# Patient Record
Sex: Female | Born: 1935 | Race: White | Hispanic: No | Smoking: Never smoker
Health system: Southern US, Community
[De-identification: ages and names within clinical notes are randomized; demographics above are authoritative.]

## PROBLEM LIST (undated history)

## (undated) DIAGNOSIS — K219 Gastro-esophageal reflux disease without esophagitis: Secondary | ICD-10-CM

## (undated) DIAGNOSIS — T8859XA Other complications of anesthesia, initial encounter: Secondary | ICD-10-CM

## (undated) DIAGNOSIS — R011 Cardiac murmur, unspecified: Secondary | ICD-10-CM

## (undated) DIAGNOSIS — I10 Essential (primary) hypertension: Secondary | ICD-10-CM

## (undated) DIAGNOSIS — L309 Dermatitis, unspecified: Secondary | ICD-10-CM

## (undated) DIAGNOSIS — F329 Major depressive disorder, single episode, unspecified: Secondary | ICD-10-CM

## (undated) DIAGNOSIS — F32A Depression, unspecified: Secondary | ICD-10-CM

## (undated) DIAGNOSIS — N189 Chronic kidney disease, unspecified: Secondary | ICD-10-CM

## (undated) DIAGNOSIS — M1990 Unspecified osteoarthritis, unspecified site: Secondary | ICD-10-CM

## (undated) DIAGNOSIS — T4145XA Adverse effect of unspecified anesthetic, initial encounter: Secondary | ICD-10-CM

## (undated) DIAGNOSIS — F419 Anxiety disorder, unspecified: Secondary | ICD-10-CM

## (undated) DIAGNOSIS — N6029 Fibroadenosis of unspecified breast: Secondary | ICD-10-CM

## (undated) HISTORY — DX: Cardiac murmur, unspecified: R01.1

## (undated) HISTORY — DX: Chronic kidney disease, unspecified: N18.9

## (undated) HISTORY — PX: MASTECTOMY: SHX3

## (undated) HISTORY — DX: Fibroadenosis of unspecified breast: N60.29

## (undated) HISTORY — DX: Essential (primary) hypertension: I10

## (undated) HISTORY — PX: HEMORRHOID SURGERY: SHX153

## (undated) HISTORY — PX: JOINT REPLACEMENT: SHX530

---

## 2004-12-26 ENCOUNTER — Ambulatory Visit: Payer: Self-pay | Admitting: Internal Medicine

## 2006-06-25 ENCOUNTER — Ambulatory Visit: Payer: Self-pay | Admitting: Internal Medicine

## 2007-08-03 IMAGING — NM NUCLEAR MEDICINE WHOLE BODY BONE SCINTIGRAPHY
2 series · 8 of 8 positions shown · non-contrast
Comparison: none

REASON FOR EXAM: Chest wall and right leg pain, history of  breast CA
COMMENTS:

[Series 1000: 3 hr wholebody · 2.40mm/px · 2 of 2 frames shown]
[frame 1/2]
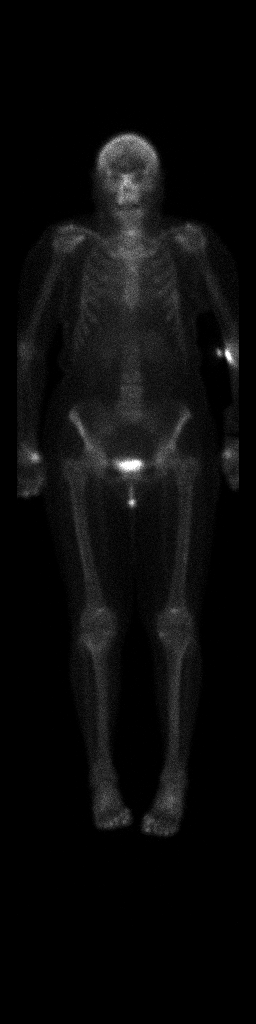
[frame 2/2]
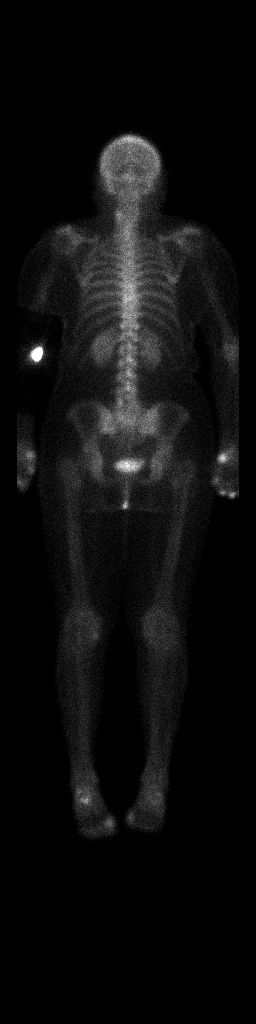

[Series 1000: statics · 2.40mm/px · 3 acquisitions, 6 frames shown]
[im 1/3]
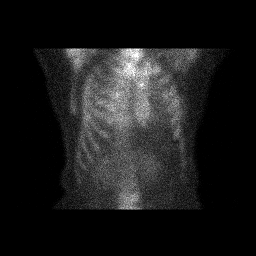
[im 1/3]
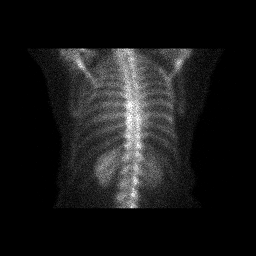
[im 2/3]
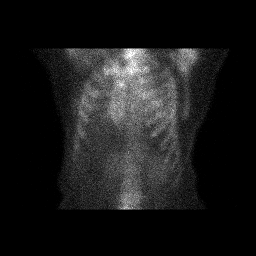
[im 2/3]
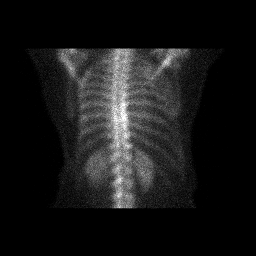
[im 3/3]
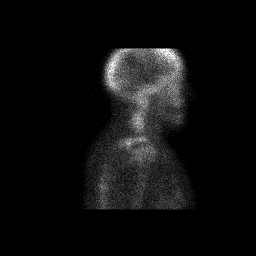
[im 3/3]
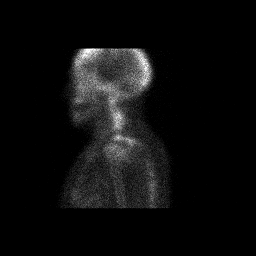

[8 of 8 positions shown; findings below may reference images not displayed]

PROCEDURE:     NM  - NM BONE WB 3 HR [DATE]  [DATE]

RESULT:     The patient received an injection of 23.74 mCi of Technetium 99m
MDP. Anterior and posterior whole body images are obtained as were static
images over the cervical region in the lateral projection and oblique views
over the chest.

The study shows increased localization over the ethmoid region and cervical
region as well as over the shoulders, wrists, knees and ankles. The
appearance suggests most likely degenerative change. This appears to be most
pronounced in the hands and wrists and in the feet and ankles and slightly
greater on the LEFT than the RIGHT. There is some increased localization
over the cervical spine region to the LEFT of midline in the mid to lower
cervical spine which is likely degenerative. No definite evidence of
metastatic disease is demonstrated. No focal rib lesions are evident.
IMPRESSION: Abnormal Bone Scan with areas of increased localization as
described. The appearance is most likely secondary to degenerative bony
disease.

## 2008-01-08 ENCOUNTER — Ambulatory Visit: Payer: Self-pay | Admitting: Neurology

## 2008-01-20 ENCOUNTER — Ambulatory Visit: Payer: Self-pay | Admitting: Emergency Medicine

## 2008-03-17 ENCOUNTER — Ambulatory Visit: Payer: Self-pay | Admitting: Ophthalmology

## 2010-01-11 ENCOUNTER — Ambulatory Visit: Payer: Self-pay | Admitting: Ophthalmology

## 2010-02-22 ENCOUNTER — Ambulatory Visit: Payer: Self-pay | Admitting: Ophthalmology

## 2010-04-03 ENCOUNTER — Ambulatory Visit: Payer: Self-pay | Admitting: Unknown Physician Specialty

## 2010-08-23 ENCOUNTER — Ambulatory Visit: Payer: Self-pay | Admitting: Unknown Physician Specialty

## 2010-08-25 ENCOUNTER — Ambulatory Visit: Payer: Self-pay | Admitting: Unknown Physician Specialty

## 2011-02-28 ENCOUNTER — Ambulatory Visit: Payer: Self-pay | Admitting: Surgery

## 2012-08-06 ENCOUNTER — Ambulatory Visit: Payer: Self-pay | Admitting: Family Medicine

## 2015-06-22 ENCOUNTER — Other Ambulatory Visit: Payer: Self-pay | Admitting: Family Medicine

## 2015-06-22 DIAGNOSIS — Z780 Asymptomatic menopausal state: Secondary | ICD-10-CM

## 2015-09-26 ENCOUNTER — Other Ambulatory Visit: Payer: Self-pay | Admitting: Internal Medicine

## 2015-09-26 DIAGNOSIS — Z1231 Encounter for screening mammogram for malignant neoplasm of breast: Secondary | ICD-10-CM

## 2015-10-11 ENCOUNTER — Ambulatory Visit: Payer: Self-pay | Admitting: Obstetrics and Gynecology

## 2015-10-17 ENCOUNTER — Encounter: Payer: Self-pay | Admitting: *Deleted

## 2015-10-19 ENCOUNTER — Encounter: Payer: Self-pay | Admitting: Obstetrics and Gynecology

## 2015-10-19 ENCOUNTER — Ambulatory Visit (INDEPENDENT_AMBULATORY_CARE_PROVIDER_SITE_OTHER): Payer: Medicare Other | Admitting: Obstetrics and Gynecology

## 2015-10-19 VITALS — BP 142/83 | HR 75 | Resp 16 | Ht 64.0 in | Wt 168.9 lb

## 2015-10-19 DIAGNOSIS — R32 Unspecified urinary incontinence: Secondary | ICD-10-CM | POA: Diagnosis not present

## 2015-10-19 DIAGNOSIS — R944 Abnormal results of kidney function studies: Secondary | ICD-10-CM

## 2015-10-19 LAB — URINALYSIS, COMPLETE
Bilirubin, UA: NEGATIVE
Glucose, UA: NEGATIVE
Ketones, UA: NEGATIVE
Leukocytes, UA: NEGATIVE
Nitrite, UA: NEGATIVE
PH UA: 5.5 (ref 5.0–7.5)
PROTEIN UA: NEGATIVE
Specific Gravity, UA: 1.02 (ref 1.005–1.030)
UUROB: 0.2 mg/dL (ref 0.2–1.0)

## 2015-10-19 LAB — MICROSCOPIC EXAMINATION

## 2015-10-19 LAB — BLADDER SCAN AMB NON-IMAGING: Scan Result: 23

## 2015-10-19 NOTE — Progress Notes (Signed)
10/19/2015 9:10 AM   Lisa Stokes [birth year] [id number]  Referring provider: No referring provider defined for this encounter.  Chief Complaint  Patient presents with  . Urinary Incontinence  . Establish Care    HPI: Patient is a 81yo female presenting today as a referral from her PCP for complaints of urinary incontinence.  Patient reports a history of mixed incontinence since she had a severe case of bronchitis approximately 18 months ago.  She reports that she "coughed so much that she destroyed her muscles down there".  She also reports occasional bowel incontinence.  She reports baseline mild SUI prior to episode of bronchitis.  She now uses 4-5 pads per day with 3 saturations.  Patient reports history of "cystitis" flares every 2-3 weeks when she experiences dysuria. She had treated these flares with OTC cystex-plus with good relief of symptoms.  Patient states that she has tried oral OAB medications in the past but cannot recall which ones.  She states that she was recently told that her kidney function was decreasing by her PCP and would like to discuss this further.    PMH: Past Medical History  Diagnosis Date  . CRI (chronic renal insufficiency)   . Heart murmur   . HTN (hypertension)   . Sclerosing adenosis of breast     Surgical History: Past Surgical History  Procedure Laterality Date  . Mastectomy Bilateral   . Hemorrhoid surgery      external    Home Medications:    Medication List       This list is accurate as of: 10/19/15  9:10 AM.  Always use your most recent med list.               atenolol 50 MG tablet  Commonly known as:  TENORMIN  TAKE 1 TABLET BY MOUTH EACH DAY     benzonatate 200 MG capsule  Commonly known as:  TESSALON  TAKE ONE CAPSULE BY MOUTH THREE TIMES DAILY AS NEEDED FOR COUGH FOR UP TO SEVEN DAYS     CHERATUSSIN AC 100-10 MG/5ML syrup  Generic drug:  guaiFENesin-codeine  TAKE 5 MLS BY MOUTH NIGHTLY AS NEEDED FOR COUGH  FOR UP TO 7 DAYS     FIRST-DUKES MOUTHWASH MT     gabapentin 300 MG capsule  Commonly known as:  NEURONTIN  Take 1 capsule (300 mg total) by mouth nightly. Increase to 2 capsules if needed     ibuprofen 200 MG tablet  Commonly known as:  ADVIL,MOTRIN  Take by mouth.     lansoprazole 15 MG capsule  Commonly known as:  PREVACID  Take by mouth.     lisinopril 20 MG tablet  Commonly known as:  PRINIVIL,ZESTRIL  TAKE 1 TABLET BY MOUTH EACH DAY     nystatin ointment  Commonly known as:  MYCOSTATIN  APPLY SMALL AMOUNTS TO CORNER OF MOUTH 2 TO 3 TIMES A DAY     PARoxetine 20 MG tablet  Commonly known as:  PAXIL  Take by mouth.     RA NAPROXEN SODIUM 220 MG tablet  Generic drug:  naproxen sodium  Take by mouth.     triamcinolone ointment 0.1 %  Commonly known as:  KENALOG  APPLY SMALL AMOUNT TO CORNERS OF MOUTH 2 TO 3 TIMES A DAY        Allergies:  Allergies  Allergen Reactions  . Augmentin [Amoxicillin-Pot Clavulanate]   . Clindamycin/Lincomycin   . Demerol [Meperidine]     Family History:  Family History  Problem Relation Age of Onset  . Colon cancer Father   . Coronary artery disease Brother   . Leukemia Brother   . Stomach cancer Maternal Grandmother   . Breast cancer Maternal Grandmother     Social History:  reports that she has never smoked. She does not have any smokeless tobacco history on file. She reports that she does not drink alcohol or use illicit drugs.  ROS: UROLOGY Frequent Urination?: No Hard to postpone urination?: No Burning/pain with urination?: Yes Get up at night to urinate?: Yes Leakage of urine?: Yes Urine stream starts and stops?: No Trouble starting stream?: Yes Do you have to strain to urinate?: No Blood in urine?: No Urinary tract infection?: No Sexually transmitted disease?: No Injury to kidneys or bladder?: No Painful intercourse?: No Weak stream?: Yes Currently pregnant?: No Vaginal bleeding?: No Last menstrual  period?: n  Gastrointestinal Nausea?: No Vomiting?: No Indigestion/heartburn?: Yes Diarrhea?: Yes Constipation?: Yes  Constitutional Fever: No Night sweats?: No Weight loss?: No Fatigue?: No  Skin Skin rash/lesions?: No Itching?: No  Eyes Blurred vision?: No Double vision?: No  Ears/Nose/Throat Sore throat?: No Sinus problems?: No  Hematologic/Lymphatic Swollen glands?: No Easy bruising?: No  Cardiovascular Leg swelling?: No Chest pain?: No  Respiratory Cough?: No Shortness of breath?: No  Endocrine Excessive thirst?: No  Musculoskeletal Back pain?: Yes Joint pain?: No  Neurological Headaches?: No Dizziness?: Yes  Psychologic Depression?: No Anxiety?: No  Physical Exam: BP 142/83 mmHg  Pulse 75  Resp 16  Ht  (1.626 m)  Wt 168 lb 14.4 oz (76.613 kg)  BMI 28.98 kg/m2  Constitutional:  Alert and oriented, No acute distress. HEENT: [place] AT, moist mucus membranes.  Trachea midline, no masses. Cardiovascular: No clubbing, cyanosis, or edema. Respiratory: Normal respiratory effort, no increased work of breathing. GI: Abdomen is soft, nontender, nondistended, no abdominal masses GU: No CVA tenderness.  Pelvic: vaginal mucosa pale with decreased rugae, grade 1-2 cystocele, minimal urine leakage with valsalva, small urethral caruncle, hard stool palpated through posterior vaginal vaultu Skin: No rashes, bruises or suspicious lesions. Lymph: No cervical or inguinal adenopathy. Neurologic: Grossly intact, no focal deficits, moving all 4 extremities. Psychiatric: Normal mood and affect.  Laboratory Data:   Urinalysis No results found for: COLORURINE, APPEARANCEUR, LABSPEC, PHURINE, GLUCOSEU, HGBUR, BILIRUBINUR, KETONESUR, PROTEINUR, UROBILINOGEN, NITRITE, LEUKOCYTESUR  Pertinent Imaging:   Assessment & Plan:   1. Incontinence- Minimal bladder prolapse present on today's exam.  Patient declined trial of OAB medication.  Patient states that she  has become accustomed to her symptoms and is not particularly bothered by them. We discussed the possible benefits of pelvic floor physical therapy. Patient is agreeable to a referral to PT. Referral placed.  She states that she would like to f/u here prn. - Urinalysis, Complete - BLADDER SCAN AMB NON-IMAGING  2. Decresed GFR- Recent GFR 48.  I discussed with patient that her declining renal function would be best addressed by a nephrologist.  She states that she will speak to her PCP regarding a referral to nephrology.  No Follow-up on file.  These notes generated with voice recognition software. I apologize for typographical errors.  Earlie Lou, FNP  [facility] [address] [place], [place] [address] [phone]

## 2016-09-04 ENCOUNTER — Other Ambulatory Visit: Payer: Self-pay | Admitting: Internal Medicine

## 2016-09-04 DIAGNOSIS — Z1231 Encounter for screening mammogram for malignant neoplasm of breast: Secondary | ICD-10-CM

## 2018-01-01 ENCOUNTER — Other Ambulatory Visit: Payer: Self-pay | Admitting: Family Medicine

## 2018-01-01 DIAGNOSIS — Z780 Asymptomatic menopausal state: Secondary | ICD-10-CM

## 2018-02-15 ENCOUNTER — Emergency Department: Payer: Medicare Other

## 2018-02-15 ENCOUNTER — Other Ambulatory Visit: Payer: Self-pay

## 2018-02-15 ENCOUNTER — Inpatient Hospital Stay
Admission: EM | Admit: 2018-02-15 | Discharge: 2018-02-18 | DRG: 481 | Disposition: A | Discharge status: Skilled Nursing Facility | Payer: Medicare Other | Attending: Specialist | Admitting: Specialist

## 2018-02-15 DIAGNOSIS — R252 Cramp and spasm: Secondary | ICD-10-CM | POA: Diagnosis not present

## 2018-02-15 DIAGNOSIS — I129 Hypertensive chronic kidney disease with stage 1 through stage 4 chronic kidney disease, or unspecified chronic kidney disease: Secondary | ICD-10-CM | POA: Diagnosis present

## 2018-02-15 DIAGNOSIS — Y92009 Unspecified place in unspecified non-institutional (private) residence as the place of occurrence of the external cause: Secondary | ICD-10-CM | POA: Diagnosis not present

## 2018-02-15 DIAGNOSIS — W010XXA Fall on same level from slipping, tripping and stumbling without subsequent striking against object, initial encounter: Secondary | ICD-10-CM | POA: Diagnosis present

## 2018-02-15 DIAGNOSIS — F329 Major depressive disorder, single episode, unspecified: Secondary | ICD-10-CM | POA: Diagnosis present

## 2018-02-15 DIAGNOSIS — E871 Hypo-osmolality and hyponatremia: Secondary | ICD-10-CM | POA: Diagnosis not present

## 2018-02-15 DIAGNOSIS — R011 Cardiac murmur, unspecified: Secondary | ICD-10-CM | POA: Diagnosis present

## 2018-02-15 DIAGNOSIS — Z79899 Other long term (current) drug therapy: Secondary | ICD-10-CM

## 2018-02-15 DIAGNOSIS — W19XXXA Unspecified fall, initial encounter: Secondary | ICD-10-CM

## 2018-02-15 DIAGNOSIS — Z885 Allergy status to narcotic agent status: Secondary | ICD-10-CM | POA: Diagnosis not present

## 2018-02-15 DIAGNOSIS — E861 Hypovolemia: Secondary | ICD-10-CM | POA: Diagnosis not present

## 2018-02-15 DIAGNOSIS — F419 Anxiety disorder, unspecified: Secondary | ICD-10-CM | POA: Diagnosis present

## 2018-02-15 DIAGNOSIS — M25572 Pain in left ankle and joints of left foot: Secondary | ICD-10-CM

## 2018-02-15 DIAGNOSIS — S72009A Fracture of unspecified part of neck of unspecified femur, initial encounter for closed fracture: Secondary | ICD-10-CM | POA: Diagnosis present

## 2018-02-15 DIAGNOSIS — Z881 Allergy status to other antibiotic agents status: Secondary | ICD-10-CM | POA: Diagnosis not present

## 2018-02-15 DIAGNOSIS — N189 Chronic kidney disease, unspecified: Secondary | ICD-10-CM | POA: Diagnosis present

## 2018-02-15 DIAGNOSIS — S72002A Fracture of unspecified part of neck of left femur, initial encounter for closed fracture: Secondary | ICD-10-CM

## 2018-02-15 DIAGNOSIS — S72142A Displaced intertrochanteric fracture of left femur, initial encounter for closed fracture: Principal | ICD-10-CM | POA: Diagnosis present

## 2018-02-15 LAB — COMPREHENSIVE METABOLIC PANEL
ALBUMIN: 4.3 g/dL (ref 3.5–5.0)
ALT: 27 U/L (ref 14–54)
ANION GAP: 10 (ref 5–15)
AST: 28 U/L (ref 15–41)
Alkaline Phosphatase: 68 U/L (ref 38–126)
BUN: 22 mg/dL — ABNORMAL HIGH (ref 6–20)
CHLORIDE: 104 mmol/L (ref 101–111)
CO2: 22 mmol/L (ref 22–32)
Calcium: 9 mg/dL (ref 8.9–10.3)
Creatinine, Ser: 0.88 mg/dL (ref 0.44–1.00)
GFR calc non Af Amer: 60 mL/min — ABNORMAL LOW (ref >60)
Glucose, Bld: 139 mg/dL — ABNORMAL HIGH (ref 65–99)
Potassium: 4.1 mmol/L (ref 3.5–5.1)
SODIUM: 136 mmol/L (ref 135–145)
Total Bilirubin: 1 mg/dL (ref 0.3–1.2)
Total Protein: 7.4 g/dL (ref 6.5–8.1)

## 2018-02-15 LAB — URINALYSIS, COMPLETE (UACMP) WITH MICROSCOPIC
BACTERIA UA: NONE SEEN
BILIRUBIN URINE: NEGATIVE
Glucose, UA: 50 mg/dL — AB
HGB URINE DIPSTICK: NEGATIVE
KETONES UR: 5 mg/dL — AB
LEUKOCYTES UA: NEGATIVE
NITRITE: NEGATIVE
PROTEIN: NEGATIVE mg/dL
Specific Gravity, Urine: 1.018 (ref 1.005–1.030)
pH: 5 (ref 5.0–8.0)

## 2018-02-15 LAB — SURGICAL PCR SCREEN
MRSA, PCR: NEGATIVE
Staphylococcus aureus: NEGATIVE

## 2018-02-15 LAB — CBC WITH DIFFERENTIAL/PLATELET
BASOS PCT: 0 %
Basophils Absolute: 0 10*3/uL (ref 0–0.1)
EOS ABS: 0.2 10*3/uL (ref 0–0.7)
EOS PCT: 2 %
HCT: 47 % (ref 35.0–47.0)
Hemoglobin: 16.3 g/dL — ABNORMAL HIGH (ref 12.0–16.0)
LYMPHS ABS: 1 10*3/uL (ref 1.0–3.6)
Lymphocytes Relative: 10 %
MCH: 31.4 pg (ref 26.0–34.0)
MCHC: 34.7 g/dL (ref 32.0–36.0)
MCV: 90.4 fL (ref 80.0–100.0)
MONOS PCT: 6 %
Monocytes Absolute: 0.6 10*3/uL (ref 0.2–0.9)
Neutro Abs: 8.3 10*3/uL — ABNORMAL HIGH (ref 1.4–6.5)
Neutrophils Relative %: 82 %
PLATELETS: 205 10*3/uL (ref 150–440)
RBC: 5.2 MIL/uL (ref 3.80–5.20)
RDW: 13.5 % (ref 11.5–14.5)
WBC: 10.1 10*3/uL (ref 3.6–11.0)

## 2018-02-15 LAB — PROTIME-INR
INR: 1
Prothrombin Time: 13.1 seconds (ref 11.4–15.2)

## 2018-02-15 LAB — TYPE AND SCREEN
ABO/RH(D): A NEG
ANTIBODY SCREEN: NEGATIVE

## 2018-02-15 LAB — APTT: aPTT: 26 seconds (ref 24–36)

## 2018-02-15 LAB — TROPONIN I: Troponin I: 0.03 ng/mL (ref <0.03)

## 2018-02-15 MED ORDER — PAROXETINE HCL 20 MG PO TABS
20.0000 mg | ORAL_TABLET | Freq: Every day | ORAL | Status: DC
  Administered 2018-02-15 – 2018-02-18 (×3): 20 mg via ORAL
  Filled 2018-02-15 (×3): qty 1

## 2018-02-15 MED ORDER — CHLORHEXIDINE GLUCONATE 4 % EX LIQD
Freq: Once | CUTANEOUS | Status: AC
  Administered 2018-02-16: via TOPICAL

## 2018-02-15 MED ORDER — DIAZEPAM 5 MG/ML IJ SOLN: 2.0000 mg | Freq: Once | INTRAMUSCULAR | Status: DC

## 2018-02-15 MED ORDER — ACETAMINOPHEN 325 MG PO TABS: 650.0000 mg | ORAL_TABLET | Freq: Four times a day (QID) | ORAL | Status: DC | PRN

## 2018-02-15 MED ORDER — LORAZEPAM 2 MG/ML IJ SOLN
1.0000 mg | INTRAMUSCULAR | Status: DC | PRN
  Administered 2018-02-16: 1 mg via INTRAVENOUS
  Filled 2018-02-15: qty 1

## 2018-02-15 MED ORDER — ONDANSETRON HCL 4 MG/2ML IJ SOLN: 4.0000 mg | Freq: Four times a day (QID) | INTRAMUSCULAR | Status: DC | PRN

## 2018-02-15 MED ORDER — CEFAZOLIN SODIUM-DEXTROSE 2-4 GM/100ML-% IV SOLN
2.0000 g | INTRAVENOUS | Status: AC
  Administered 2018-02-16: 2 g via INTRAVENOUS
  Filled 2018-02-15 (×2): qty 100

## 2018-02-15 MED ORDER — CYCLOBENZAPRINE HCL 10 MG PO TABS
10.0000 mg | ORAL_TABLET | Freq: Three times a day (TID) | ORAL | Status: DC | PRN
  Administered 2018-02-15 – 2018-02-16 (×3): 10 mg via ORAL
  Filled 2018-02-15 (×5): qty 1

## 2018-02-15 MED ORDER — FENTANYL CITRATE (PF) 100 MCG/2ML IJ SOLN
50.0000 ug | Freq: Once | INTRAMUSCULAR | Status: AC
  Administered 2018-02-15: 50 ug via INTRAVENOUS

## 2018-02-15 MED ORDER — SODIUM CHLORIDE 0.9 % IV SOLN
INTRAVENOUS | Status: DC
  Administered 2018-02-15 – 2018-02-17 (×3): via INTRAVENOUS

## 2018-02-15 MED ORDER — MORPHINE SULFATE (PF) 2 MG/ML IV SOLN: 2.0000 mg | INTRAVENOUS | Status: DC | PRN

## 2018-02-15 MED ORDER — ACETAMINOPHEN 650 MG RE SUPP: 650.0000 mg | Freq: Four times a day (QID) | RECTAL | Status: DC | PRN

## 2018-02-15 MED ORDER — HYDROCODONE-ACETAMINOPHEN 5-325 MG PO TABS
1.0000 | ORAL_TABLET | ORAL | Status: DC | PRN
  Administered 2018-02-15 – 2018-02-16 (×2): 2 via ORAL
  Filled 2018-02-15 (×2): qty 2

## 2018-02-15 MED ORDER — ATENOLOL 25 MG PO TABS
50.0000 mg | ORAL_TABLET | Freq: Every day | ORAL | Status: DC
  Administered 2018-02-15 – 2018-02-18 (×4): 50 mg via ORAL
  Filled 2018-02-15 (×4): qty 2

## 2018-02-15 MED ORDER — MORPHINE SULFATE (PF) 2 MG/ML IV SOLN
1.0000 mg | INTRAVENOUS | Status: DC | PRN
  Administered 2018-02-15 – 2018-02-16 (×4): 1 mg via INTRAVENOUS
  Filled 2018-02-15 (×4): qty 1

## 2018-02-15 MED ORDER — ONDANSETRON HCL 4 MG PO TABS: 4.0000 mg | ORAL_TABLET | Freq: Four times a day (QID) | ORAL | Status: DC | PRN

## 2018-02-15 MED ORDER — FENTANYL CITRATE (PF) 100 MCG/2ML IJ SOLN
INTRAMUSCULAR | Status: AC
  Filled 2018-02-15: qty 2

## 2018-02-15 NOTE — ED Triage Notes (Signed)
Pt arrived via ems for c/o fall and left hip pain - she states she has fell several times over the last few weeks

## 2018-02-15 NOTE — Progress Notes (Signed)
Patient admitted from ED. [place]ucks traction applied. Family at bedside.

## 2018-02-15 NOTE — ED Notes (Signed)
Pt was given both doses of fentanyl from the same vial - charge nurse and pharmacy aware

## 2018-02-15 NOTE — H&P (Signed)
[facility] [place] [facility]   PATIENT NAME: Lisa Stokes    MR#:  [id number]  DATE OF BIRTH:  1934-08-24  DATE OF ADMISSION:  02/15/2018  PRIMARY CARE PHYSICIAN: Mickey Farber, MD   REQUESTING/REFERRING PHYSICIAN:  Nita Sickle, MD     CHIEF COMPLAINT:   Chief Complaint  Patient presents with  . Fall  . Hip Pain    HISTORY OF PRESENT ILLNESS: Lisa Stokes  is a 84 y.o. female with a known history of hypertension who states that she has been kind of wobbly on her feet recently who went to the kitchen and fell patient was noted to have a left-sided femoral fracture.  Patient complains of severe pain and spasms.  She otherwise denies any cardiac or pulmonary history.  Denies any chest pain or shortness of breath     PAST MEDICAL HISTORY:   Past Medical History:  Diagnosis Date  . CRI (chronic renal insufficiency)   . Heart murmur   . HTN (hypertension)   . Sclerosing adenosis of breast     PAST SURGICAL HISTORY:  Past Surgical History:  Procedure Laterality Date  . HEMORRHOID SURGERY     external  . MASTECTOMY Bilateral     SOCIAL HISTORY:  Social History   Tobacco Use  . Smoking status: Never Smoker  . Smokeless tobacco: Never Used  Substance Use Topics  . Alcohol use: No    Alcohol/week: 0.0 oz    FAMILY HISTORY:  Family History  Problem Relation Age of Onset  . Colon cancer Father   . Coronary artery disease Brother   . Leukemia Brother   . Stomach cancer Maternal Grandmother   . Breast cancer Maternal Grandmother     DRUG ALLERGIES:  Allergies  Allergen Reactions  . Augmentin [Amoxicillin-Pot Clavulanate]   . Clindamycin/Lincomycin   . Demerol [Meperidine]   . Percocet [Oxycodone-Acetaminophen]     REVIEW OF SYSTEMS:   CONSTITUTIONAL: No fever, fatigue or weakness.  EYES: No blurred or double vision.  EARS, NOSE, AND THROAT: No tinnitus or ear pain.  RESPIRATORY: No cough, shortness of breath,  wheezing or hemoptysis.  CARDIOVASCULAR: No chest pain, orthopnea, edema.  GASTROINTESTINAL: No nausea, vomiting, diarrhea or abdominal pain.  GENITOURINARY: No dysuria, hematuria.  ENDOCRINE: No polyuria, nocturia,  HEMATOLOGY: No anemia, easy bruising or bleeding SKIN: No rash or lesion. MUSCULOSKELETAL: Left hip pain NEUROLOGIC: No tingling, numbness, weakness.  PSYCHIATRY: No anxiety or depression.   MEDICATIONS AT HOME:  Prior to Admission medications   Medication Sig Start Date End Date Taking? Authorizing Provider  atenolol (TENORMIN) 50 MG tablet TAKE 1 TABLET BY MOUTH EACH DAY 09/19/15  Yes [provider]  lisinopril (PRINIVIL,ZESTRIL) 40 MG tablet Take 40 mg by mouth daily. for blood pressure 01/29/18  Yes [provider]  PARoxetine (PAXIL) 20 MG tablet Take 20 mg by mouth daily. 01/29/18  Yes [provider]      PHYSICAL EXAMINATION:   VITAL SIGNS: Blood pressure (!) 188/94, pulse 78, temperature 97.8 F (36.6 C), temperature source Oral, resp. rate 16, height 5\' 4"  (1.626 m), weight 172 lb (78 kg), SpO2 95 %.  GENERAL:  84 y.o.-year-old patient lying in the bed with no acute distress.  EYES: Pupils equal, round, reactive to light and accommodation. No scleral icterus. Extraocular muscles intact.  HEENT: Head atraumatic, normocephalic. Oropharynx and nasopharynx clear.  NECK:  Supple, no jugular venous distention. No thyroid enlargement, no tenderness.  LUNGS: Normal breath sounds bilaterally,  no wheezing, rales,rhonchi or crepitation. No use of accessory muscles of respiration.  CARDIOVASCULAR: S1, S2 normal. No murmurs, rubs, or gallops.  ABDOMEN: Soft, nontender, nondistended. Bowel sounds present. No organomegaly or mass.  EXTREMITIES: No pedal edema, cyanosis, or clubbing.  NEUROLOGIC: Cranial nerves II through XII are intact. Muscle strength 5/5 in all extremities. Sensation intact. Gait not checked.  PSYCHIATRIC: The patient is alert and  oriented x 3.  SKIN: No obvious rash, lesion, or ulcer.   LABORATORY PANEL:   CBC Recent Labs  Lab 02/15/18 1545  WBC 10.1  HGB 16.3*  HCT 47.0  PLT 205  MCV 90.4  MCH 31.4  MCHC 34.7  RDW 13.5  LYMPHSABS 1.0  MONOABS 0.6  EOSABS 0.2  BASOSABS 0.0   ------------------------------------------------------------------------------------------------------------------  Chemistries  Recent Labs  Lab 02/15/18 1545  NA 136  K 4.1  CL 104  CO2 22  GLUCOSE 139*  BUN 22*  CREATININE 0.88  CALCIUM 9.0  AST 28  ALT 27  ALKPHOS 68  BILITOT 1.0   ------------------------------------------------------------------------------------------------------------------ estimated creatinine clearance is 50.7 mL/min (by C-G formula based on SCr of 0.88 mg/dL). ------------------------------------------------------------------------------------------------------------------ No results for input(s): TSH, T4TOTAL, T3FREE, THYROIDAB in the last 72 hours.  Invalid input(s): FREET3   Coagulation profile Recent Labs  Lab 02/15/18 1545  INR 1.00   ------------------------------------------------------------------------------------------------------------------- No results for input(s): DDIMER in the last 72 hours. -------------------------------------------------------------------------------------------------------------------  Cardiac Enzymes No results for input(s): CKMB, TROPONINI, MYOGLOBIN in the last 168 hours.  Invalid input(s): CK ------------------------------------------------------------------------------------------------------------------ Invalid input(s): POCBNP  ---------------------------------------------------------------------------------------------------------------  Urinalysis    Component Value Date/Time   APPEARANCEUR Clear 10/19/2015 0858   GLUCOSEU Negative 10/19/2015 0858   BILIRUBINUR Negative 10/19/2015 0858   PROTEINUR Negative 10/19/2015 0858    NITRITE Negative 10/19/2015 0858   LEUKOCYTESUR Negative 10/19/2015 0858     RADIOLOGY: Dg Chest Portable 1 View  Result Date: 02/15/2018 CLINICAL DATA:  Preop.  No history of heart and lung disease. EXAM: PORTABLE CHEST 1 VIEW COMPARISON:  Chest CT, 04/03/2010 FINDINGS: Cardiac silhouette is borderline enlarged. No mediastinal or hilar masses. No convincing adenopathy. There is prominence of the aortic arch. Clear lungs.  No pleural effusion or pneumothorax. Skeletal structures are demineralized but grossly intact. IMPRESSION: No acute cardiopulmonary disease. Electronically Signed   By: Amie Portlandavid  Ormond M.D.   On: 02/15/2018 16:58   Dg Hip Unilat With Pelvis 2-3 Views Left  Result Date: 02/15/2018 CLINICAL DATA:  Fall, left hip pain. EXAM: DG HIP (WITH OR WITHOUT PELVIS) 2-3V LEFT COMPARISON:  None FINDINGS: There is a nondisplaced left femoral intertrochanteric fracture. No subluxation or dislocation. Mild symmetric degenerative changes in the hips bilaterally. IMPRESSION: Nondisplaced left femoral intertrochanteric fracture. Electronically Signed   By: Charlett NoseKevin  Dover M.D.   On: 02/15/2018 16:18    EKG: Orders placed or performed during the hospital encounter of 02/15/18  . ED EKG  . ED EKG  . EKG 12-Lead  . EKG 12-Lead    IMPRESSION AND PLAN: Patient is a 84 year old white female status post fall 1.  Left-sided hip fracture after a fall Preoperatively patient has no cardiopulmonary symptoms Medically stable for surgery She is at moderate risk of surgical complication  2.  Accelerated hypertension continue therapy with atenolol I will use as needed IV hydralazine  3.  Depression anxiety continue Paxil  4.  DVT prophylaxis currently on hold due for surgery tomorrow    All the records are reviewed and case discussed with ED provider. Management plans discussed with  the patient, family and they are in agreement.  CODE STATUS: Code Status History    This patient does not have a  recorded code status. Please follow your organizational policy for patients in this situation.       TOTAL TIME TAKING CARE OF THIS PATIENT:89minutes.    Auburn Bilberry M.D on 02/15/2018 at 5:07 PM  Between 7am to 6pm - Pager - [phone]  After 6pm go to [email or web address] - password [email or web address]  [facility] Office  [phone]  CC: Primary care physician; Mickey Farber, MD

## 2018-02-15 NOTE — ED Provider Notes (Signed)
[facility] Emergency Department Provider Note  ____________________________________________  Time seen: Approximately 4:02 PM  I have reviewed the triage vital signs and the nursing notes.   HISTORY  Chief Complaint Fall and Hip Pain   HPI Lisa Stokes is a 84 y.o. female with a history of chronic kidney disease and hypertension who presents for evaluation of mechanical fall. Patient reports that she was walking in her house and she tripped and fell onto her left hip area she was unable to stand up. She crawled to the telephone and call 911. She is complaining of 8 out of 10 sharp left-sided hip pain worse with movement or palpation. The pain is improved after she received fentanyl per EMS. She denies head trauma or LOC. She is not on blood thinners. She reports 1 prior fall in the last 2 weeks. She does not have a walker or cane. According to her daughters patient has been a little bit tired this week. No recurrent falls. No recent illness like URI symptoms, fever, vomiting, diarrhea, dysuria. Patient denies headache, neck pain, back pain, chest pain, abdominal pain.  Past Medical History:  Diagnosis Date  . CRI (chronic renal insufficiency)   . Heart murmur   . HTN (hypertension)   . Sclerosing adenosis of breast     Patient Active Problem List   Diagnosis Date Noted  . Hip fracture (HCC) 02/15/2018    Past Surgical History:  Procedure Laterality Date  . HEMORRHOID SURGERY     external  . MASTECTOMY Bilateral     Prior to Admission medications   Medication Sig Start Date End Date Taking? Authorizing Provider  atenolol (TENORMIN) 50 MG tablet TAKE 1 TABLET BY MOUTH EACH DAY 09/19/15  Yes [provider]  lisinopril (PRINIVIL,ZESTRIL) 40 MG tablet Take 40 mg by mouth daily. for blood pressure 01/29/18  Yes [provider]  PARoxetine (PAXIL) 20 MG tablet Take 20 mg by mouth daily. 01/29/18  Yes [provider]     Allergies Augmentin [amoxicillin-pot clavulanate]; Clindamycin/lincomycin; Demerol [meperidine]; and Percocet [oxycodone-acetaminophen]  Family History  Problem Relation Age of Onset  . Colon cancer Father   . Coronary artery disease Brother   . Leukemia Brother   . Stomach cancer Maternal Grandmother   . Breast cancer Maternal Grandmother     Social History Social History   Tobacco Use  . Smoking status: Never Smoker  . Smokeless tobacco: Never Used  Substance Use Topics  . Alcohol use: No    Alcohol/week: 0.0 oz  . Drug use: No    Review of Systems Constitutional: Negative for fever. Eyes: Negative for visual changes. ENT: Negative for facial injury or neck injury Cardiovascular: Negative for chest injury. Respiratory: Negative for shortness of breath. Negative for chest wall injury. Gastrointestinal: Negative for abdominal pain or injury. Genitourinary: Negative for dysuria. Musculoskeletal: Negative for back injury, + L hip pain Skin: Negative for laceration/abrasions. Neurological: Negative for head injury.   ____________________________________________   PHYSICAL EXAM:  VITAL SIGNS: ED Triage Vitals  Enc Vitals Group     BP 02/15/18 1541 (!) 188/94     Pulse Rate 02/15/18 1541 78     Resp 02/15/18 1541 16     Temp 02/15/18 1541 97.8 F (36.6 C)     Temp Source 02/15/18 1541 Oral     SpO2 02/15/18 1538 95 %     Weight 02/15/18 1541 172 lb (78 kg)     Height 02/15/18 1541 5\' 4"  (  1.626 m)     Head Circumference --      Peak Flow --      Pain Score 02/15/18 1541 3     Pain Loc --      Pain Edu? --      Excl. in GC? --    Full spinal precautions maintained throughout the trauma exam. Constitutional: Alert and oriented. No acute distress. Does not appear intoxicated. HEENT Head: Normocephalic and atraumatic. Face: No facial bony tenderness. Stable midface Ears: No hemotympanum bilaterally. No Battle sign Eyes: No eye injury. PERRL. No raccoon  eyes Nose: Nontender. No epistaxis. No rhinorrhea Mouth/Throat: Mucous membranes are moist. No oropharyngeal blood. No dental injury. Airway patent without stridor. Normal voice. Neck: no C-collar in place. No midline c-spine tenderness.  Cardiovascular: Normal rate, regular rhythm. Normal and symmetric distal pulses are present in all extremities. Pulmonary/Chest: Chest wall is stable and nontender to palpation/compression. Normal respiratory effort. Breath sounds are normal. No crepitus.  Abdominal: Soft, nontender, non distended. Musculoskeletal: Tenderness with internal rotation of the L hip, limb is not shortened or externally rotated. Nontender with normal full range of motion in all other extremities. No deformities. No thoracic or lumbar midline spinal tenderness. Pelvis is stable. Skin: Skin is warm, dry and intact. No abrasions or contutions. Psychiatric: Speech and behavior are appropriate. Neurological: Normal speech and language. Moves all extremities to command. No gross focal neurologic deficits are appreciated.  Glascow Coma Score: 4 - Opens eyes on own 6 - Follows simple motor commands 5 - Alert and oriented GCS: 15  ____________________________________________   LABS (all labs ordered are listed, but only abnormal results are displayed)  Labs Reviewed  CBC WITH DIFFERENTIAL/PLATELET - Abnormal; Notable for the following components:      Result Value   Hemoglobin 16.3 (*)    Neutro Abs 8.3 (*)    All other components within normal limits  COMPREHENSIVE METABOLIC PANEL - Abnormal; Notable for the following components:   Glucose, Bld 139 (*)    BUN 22 (*)    GFR calc non Af Amer 60 (*)    All other components within normal limits  URINALYSIS, COMPLETE (UACMP) WITH MICROSCOPIC - Abnormal; Notable for the following components:   Color, Urine YELLOW (*)    APPearance CLEAR (*)    Glucose, UA 50 (*)    Ketones, ur 5 (*)    Squamous Epithelial / LPF 0-5 (*)    All  other components within normal limits  TROPONIN I - Abnormal; Notable for the following components:   Troponin I 0.03 (*)    All other components within normal limits  SURGICAL PCR SCREEN  PROTIME-INR  APTT  CBC  BASIC METABOLIC PANEL  TYPE AND SCREEN   ____________________________________________  EKG  ED ECG REPORT I, Nita Sicklearolina Wynetta Seith, the attending physician, personally viewed and interpreted this ECG.  Normal sinus rhythm, rate of 76, normal intervals, normal axis, LVH with T-wave inversions in the lateral leads, no ST elevations or depressions. No prior for comparison. ____________________________________________  RADIOLOGY  I have personally reviewed the images performed during this visit and I agree with the Radiologist's read.   Interpretation by Radiologist:  Dg Chest Portable 1 View  Result Date: 02/15/2018 CLINICAL DATA:  Preop.  No history of heart and lung disease. EXAM: PORTABLE CHEST 1 VIEW COMPARISON:  Chest CT, 04/03/2010 FINDINGS: Cardiac silhouette is borderline enlarged. No mediastinal or hilar masses. No convincing adenopathy. There is prominence of the aortic arch. Clear lungs.  No pleural effusion or pneumothorax. Skeletal structures are demineralized but grossly intact. IMPRESSION: No acute cardiopulmonary disease. Electronically Signed   By: Amie Portland M.D.   On: 02/15/2018 16:58   Dg Hip Unilat With Pelvis 2-3 Views Left  Result Date: 02/15/2018 CLINICAL DATA:  Fall, left hip pain. EXAM: DG HIP (WITH OR WITHOUT PELVIS) 2-3V LEFT COMPARISON:  None FINDINGS: There is a nondisplaced left femoral intertrochanteric fracture. No subluxation or dislocation. Mild symmetric degenerative changes in the hips bilaterally. IMPRESSION: Nondisplaced left femoral intertrochanteric fracture. Electronically Signed   By: Charlett Nose M.D.   On: 02/15/2018 16:18    ____________________________________________   PROCEDURES  Procedure(s) performed:  None Procedures Critical Care performed:  None ____________________________________________   INITIAL IMPRESSION / ASSESSMENT AND PLAN / ED COURSE  84 y.o. female with a history of chronic kidney disease and hypertension who presents for evaluation of L hip pain s/p mechanical fall. No head trauma or LOC, no blood thinner. LLE with no obvious deformity, patient with ttp with rotation of the L hip. XR pending. Since patient seems more fatigued this week will check labs/ UA/ EKG to rule out worsening kidney injury, dehydration, anemia, electrolyte abnormalities, arrhythmia, or UTI.    _________________________ 4:29 PM on 02/15/2018 -----------------------------------------  X-ray positive for hip fracture. Discussed with Dr. Hyacinth Meeker orthopedics will plan for surgical repair in the morning. Patient will be made nothing by mouth at midnight. Discussed with the hospitalist for admission.   As part of my medical decision making, I reviewed the following data within the electronic MEDICAL RECORD NUMBER History obtained from family, Nursing notes reviewed and incorporated, Labs reviewed , EKG interpreted , Discussed with admitting physician , A consult was requested and obtained from this/these consultant(s) Ortho, Notes from prior ED visits and [place] Controlled Substance Database    Pertinent labs & imaging results that were available during my care of the patient were reviewed by me and considered in my medical decision making (see chart for details).    ____________________________________________   FINAL CLINICAL IMPRESSION(S) / ED DIAGNOSES  Final diagnoses:  Closed fracture of left hip, initial encounter (HCC)  Fall, initial encounter      NEW MEDICATIONS STARTED DURING THIS VISIT:  ED Discharge Orders    None       Note:  This document was prepared using Dragon voice recognition software and may include unintentional dictation errors.    Don Perking, [place], MD 02/16/18 0001

## 2018-02-15 NOTE — Consult Note (Signed)
ORTHOPAEDIC CONSULTATION  REQUESTING PHYSICIAN: Auburn Bilberry, MD  Chief Complaint: left hip pain   HPI: Lisa Stokes is a 84 y.o. female who complains of  Left hip pain after a fall at home today.  Exam and x-rays in the ER show a minimally displaced left intertrochanteric hip fracture.  Treatment options including surgery and non operative were discussed with the patient and family at length.  Risks of surgery and post op protocol also discussed.  She wishes to proceed with surgery in the morning.   Past Medical History:  Diagnosis Date  . CRI (chronic renal insufficiency)   . Heart murmur   . HTN (hypertension)   . Sclerosing adenosis of breast    Past Surgical History:  Procedure Laterality Date  . HEMORRHOID SURGERY     external  . MASTECTOMY Bilateral    Social History   Socioeconomic History  . Marital status: Married    Spouse name: None  . Number of children: None  . Years of education: None  . Highest education level: None  Social Needs  . Financial resource strain: None  . Food insecurity - worry: None  . Food insecurity - inability: None  . Transportation needs - medical: None  . Transportation needs - non-medical: None  Occupational History  . None  Tobacco Use  . Smoking status: Never Smoker  . Smokeless tobacco: Never Used  Substance and Sexual Activity  . Alcohol use: No    Alcohol/week: 0.0 oz  . Drug use: No  . Sexual activity: None  Other Topics Concern  . None  Social History Narrative  . None   Family History  Problem Relation Age of Onset  . Colon cancer Father   . Coronary artery disease Brother   . Leukemia Brother   . Stomach cancer Maternal Grandmother   . Breast cancer Maternal Grandmother    Allergies  Allergen Reactions  . Augmentin [Amoxicillin-Pot Clavulanate]   . Clindamycin/Lincomycin   . Demerol [Meperidine]   . Percocet [Oxycodone-Acetaminophen]    Prior to Admission medications   Medication Sig Start  Date End Date Taking? Authorizing Provider  atenolol (TENORMIN) 50 MG tablet TAKE 1 TABLET BY MOUTH EACH DAY 09/19/15  Yes [provider]  lisinopril (PRINIVIL,ZESTRIL) 40 MG tablet Take 40 mg by mouth daily. for blood pressure 01/29/18  Yes [provider]  PARoxetine (PAXIL) 20 MG tablet Take 20 mg by mouth daily. 01/29/18  Yes [provider]   Dg Chest Portable 1 View  Result Date: 02/15/2018 CLINICAL DATA:  Preop.  No history of heart and lung disease. EXAM: PORTABLE CHEST 1 VIEW COMPARISON:  Chest CT, 04/03/2010 FINDINGS: Cardiac silhouette is borderline enlarged. No mediastinal or hilar masses. No convincing adenopathy. There is prominence of the aortic arch. Clear lungs.  No pleural effusion or pneumothorax. Skeletal structures are demineralized but grossly intact. IMPRESSION: No acute cardiopulmonary disease. Electronically Signed   By: Amie Portland M.D.   On: 02/15/2018 16:58   Dg Hip Unilat With Pelvis 2-3 Views Left  Result Date: 02/15/2018 CLINICAL DATA:  Fall, left hip pain. EXAM: DG HIP (WITH OR WITHOUT PELVIS) 2-3V LEFT COMPARISON:  None FINDINGS: There is a nondisplaced left femoral intertrochanteric fracture. No subluxation or dislocation. Mild symmetric degenerative changes in the hips bilaterally. IMPRESSION: Nondisplaced left femoral intertrochanteric fracture. Electronically Signed   By: Charlett Nose M.D.   On: 02/15/2018 16:18    Positive ROS: All other systems have been reviewed and were otherwise  negative with the exception of those mentioned in the HPI and as above.  Physical Exam: General: Alert, no acute distress Cardiovascular: No pedal edema Respiratory: No cyanosis, no use of accessory musculature GI: No organomegaly, abdomen is soft and non-tender Skin: No lesions in the area of chief complaint Neurologic: Sensation intact distally Psychiatric: Patient is competent for consent with normal mood and affect Lymphatic: No axillary or  cervical lymphadenopathy  MUSCULOSKELETAL: Left leg painful to rotate.  Length good.  Skin and csm intact. No other injuries noted.   Assessment: Left hip fracture  Plan: ORIF in AM with TFN    Valinda HoarMILLER,Naomee Nowland E, MD [phone]   02/15/2018 10:59 PM

## 2018-02-16 ENCOUNTER — Encounter
Admission: EM | Disposition: A | Discharge status: Skilled Nursing Facility | Payer: Self-pay | Source: Home / Self Care | Attending: Specialist

## 2018-02-16 ENCOUNTER — Inpatient Hospital Stay: Payer: Medicare Other | Admitting: Certified Registered Nurse Anesthetist

## 2018-02-16 ENCOUNTER — Inpatient Hospital Stay: Payer: Medicare Other

## 2018-02-16 HISTORY — PX: INTRAMEDULLARY (IM) NAIL INTERTROCHANTERIC: SHX5875

## 2018-02-16 LAB — CBC
HCT: 41.6 % (ref 35.0–47.0)
HEMOGLOBIN: 14.6 g/dL (ref 12.0–16.0)
MCH: 31.6 pg (ref 26.0–34.0)
MCHC: 35 g/dL (ref 32.0–36.0)
MCV: 90.3 fL (ref 80.0–100.0)
Platelets: 195 10*3/uL (ref 150–440)
RBC: 4.61 MIL/uL (ref 3.80–5.20)
RDW: 13.2 % (ref 11.5–14.5)
WBC: 9.4 10*3/uL (ref 3.6–11.0)

## 2018-02-16 LAB — BASIC METABOLIC PANEL
Anion gap: 8 (ref 5–15)
BUN: 20 mg/dL (ref 6–20)
CHLORIDE: 101 mmol/L (ref 101–111)
CO2: 24 mmol/L (ref 22–32)
CREATININE: 0.82 mg/dL (ref 0.44–1.00)
Calcium: 8.4 mg/dL — ABNORMAL LOW (ref 8.9–10.3)
GFR calc Af Amer: >60 mL/min (ref >60)
GFR calc non Af Amer: >60 mL/min (ref >60)
Glucose, Bld: 134 mg/dL — ABNORMAL HIGH (ref 65–99)
Potassium: 4.4 mmol/L (ref 3.5–5.1)
SODIUM: 133 mmol/L — AB (ref 135–145)

## 2018-02-16 SURGERY — FIXATION, FRACTURE, INTERTROCHANTERIC, WITH INTRAMEDULLARY ROD
Anesthesia: General | Site: Hip | Laterality: Left | Wound class: Clean

## 2018-02-16 MED ORDER — BISACODYL 10 MG RE SUPP
10.0000 mg | Freq: Every day | RECTAL | Status: DC | PRN
  Administered 2018-02-18: 10 mg via RECTAL
  Filled 2018-02-16: qty 1

## 2018-02-16 MED ORDER — ONDANSETRON HCL 4 MG/2ML IJ SOLN: 4.0000 mg | Freq: Once | INTRAMUSCULAR | Status: DC | PRN

## 2018-02-16 MED ORDER — PROPOFOL 500 MG/50ML IV EMUL
INTRAVENOUS | Status: AC
  Filled 2018-02-16: qty 50

## 2018-02-16 MED ORDER — MORPHINE SULFATE (PF) 2 MG/ML IV SOLN
0.5000 mg | INTRAVENOUS | Status: DC | PRN
  Administered 2018-02-16: 1 mg via INTRAVENOUS
  Filled 2018-02-16: qty 1

## 2018-02-16 MED ORDER — LIDOCAINE HCL (PF) 2 % IJ SOLN
INTRAMUSCULAR | Status: AC
  Filled 2018-02-16: qty 10

## 2018-02-16 MED ORDER — MIDAZOLAM HCL 5 MG/5ML IJ SOLN
INTRAMUSCULAR | Status: DC | PRN
  Administered 2018-02-16: 1 mg via INTRAVENOUS

## 2018-02-16 MED ORDER — ZOLPIDEM TARTRATE 5 MG PO TABS: 5.0000 mg | ORAL_TABLET | Freq: Every evening | ORAL | Status: DC | PRN

## 2018-02-16 MED ORDER — BUPIVACAINE-EPINEPHRINE (PF) 0.25% -1:200000 IJ SOLN
INTRAMUSCULAR | Status: AC
  Filled 2018-02-16: qty 30

## 2018-02-16 MED ORDER — BUPIVACAINE-EPINEPHRINE (PF) 0.5% -1:200000 IJ SOLN
INTRAMUSCULAR | Status: AC
Start: 2018-02-16 — End: 2018-02-16
  Filled 2018-02-16: qty 30

## 2018-02-16 MED ORDER — OXYMETAZOLINE HCL 0.05 % NA SOLN
NASAL | Status: AC
  Filled 2018-02-16: qty 15

## 2018-02-16 MED ORDER — FENTANYL CITRATE (PF) 100 MCG/2ML IJ SOLN: 25.0000 ug | INTRAMUSCULAR | Status: DC | PRN

## 2018-02-16 MED ORDER — FERROUS SULFATE 325 (65 FE) MG PO TABS
325.0000 mg | ORAL_TABLET | Freq: Every day | ORAL | Status: DC
  Administered 2018-02-17 – 2018-02-18 (×2): 325 mg via ORAL
  Filled 2018-02-16 (×2): qty 1

## 2018-02-16 MED ORDER — METOCLOPRAMIDE HCL 10 MG PO TABS: 5.0000 mg | ORAL_TABLET | Freq: Three times a day (TID) | ORAL | Status: DC | PRN

## 2018-02-16 MED ORDER — PROPOFOL 500 MG/50ML IV EMUL
INTRAVENOUS | Status: DC | PRN
  Administered 2018-02-16: 25 ug/kg/min via INTRAVENOUS

## 2018-02-16 MED ORDER — PHENOL 1.4 % MT LIQD
1.0000 | OROMUCOSAL | Status: DC | PRN
  Filled 2018-02-16: qty 177

## 2018-02-16 MED ORDER — ACETAMINOPHEN 325 MG PO TABS: 325.0000 mg | ORAL_TABLET | Freq: Four times a day (QID) | ORAL | Status: DC | PRN

## 2018-02-16 MED ORDER — ALUM & MAG HYDROXIDE-SIMETH 200-200-20 MG/5ML PO SUSP: 30.0000 mL | ORAL | Status: DC | PRN

## 2018-02-16 MED ORDER — FLEET ENEMA 7-19 GM/118ML RE ENEM: 1.0000 | ENEMA | Freq: Once | RECTAL | Status: DC | PRN

## 2018-02-16 MED ORDER — NEOMYCIN-POLYMYXIN B GU 40-200000 IR SOLN
Status: DC | PRN
  Administered 2018-02-16: 2 mL

## 2018-02-16 MED ORDER — CEFAZOLIN SODIUM-DEXTROSE 2-4 GM/100ML-% IV SOLN
2.0000 g | Freq: Three times a day (TID) | INTRAVENOUS | Status: AC
  Administered 2018-02-16 – 2018-02-17 (×3): 2 g via INTRAVENOUS
  Filled 2018-02-16 (×3): qty 100

## 2018-02-16 MED ORDER — BACLOFEN 5 MG HALF TABLET
5.0000 mg | ORAL_TABLET | ORAL | Status: AC
  Administered 2018-02-16: 5 mg via ORAL
  Filled 2018-02-16: qty 1

## 2018-02-16 MED ORDER — BUPIVACAINE-EPINEPHRINE (PF) 0.25% -1:200000 IJ SOLN
INTRAMUSCULAR | Status: DC | PRN
  Administered 2018-02-16: 30 mL

## 2018-02-16 MED ORDER — NEOMYCIN-POLYMYXIN B GU 40-200000 IR SOLN
Status: AC
  Filled 2018-02-16: qty 2

## 2018-02-16 MED ORDER — DOCUSATE SODIUM 100 MG PO CAPS
100.0000 mg | ORAL_CAPSULE | Freq: Two times a day (BID) | ORAL | Status: DC
  Administered 2018-02-16 (×2): 100 mg via ORAL
  Filled 2018-02-16 (×2): qty 1

## 2018-02-16 MED ORDER — ONDANSETRON HCL 4 MG PO TABS: 4.0000 mg | ORAL_TABLET | Freq: Four times a day (QID) | ORAL | Status: DC | PRN

## 2018-02-16 MED ORDER — MIDAZOLAM HCL 2 MG/2ML IJ SOLN
INTRAMUSCULAR | Status: AC
  Filled 2018-02-16: qty 2

## 2018-02-16 MED ORDER — HYDROCODONE-ACETAMINOPHEN 5-325 MG PO TABS
1.0000 | ORAL_TABLET | ORAL | Status: DC | PRN
  Administered 2018-02-16: 1 via ORAL
  Administered 2018-02-17: 2 via ORAL
  Filled 2018-02-16: qty 1
  Filled 2018-02-16: qty 2

## 2018-02-16 MED ORDER — TRAMADOL HCL 50 MG PO TABS
50.0000 mg | ORAL_TABLET | Freq: Four times a day (QID) | ORAL | Status: DC
  Administered 2018-02-16 – 2018-02-18 (×9): 50 mg via ORAL
  Filled 2018-02-16 (×9): qty 1

## 2018-02-16 MED ORDER — SODIUM CHLORIDE 0.45 % IV SOLN
INTRAVENOUS | Status: DC
  Administered 2018-02-16 – 2018-02-17 (×2): via INTRAVENOUS

## 2018-02-16 MED ORDER — PHENYLEPHRINE HCL 10 MG/ML IJ SOLN
INTRAMUSCULAR | Status: DC | PRN
  Administered 2018-02-16: 100 ug via INTRAVENOUS
  Administered 2018-02-16 (×2): 200 ug via INTRAVENOUS
  Administered 2018-02-16 (×2): 100 ug via INTRAVENOUS
  Administered 2018-02-16: 200 ug via INTRAVENOUS
  Administered 2018-02-16 (×2): 100 ug via INTRAVENOUS

## 2018-02-16 MED ORDER — METOCLOPRAMIDE HCL 5 MG/ML IJ SOLN: 5.0000 mg | Freq: Three times a day (TID) | INTRAMUSCULAR | Status: DC | PRN

## 2018-02-16 MED ORDER — HYDROCODONE-ACETAMINOPHEN 7.5-325 MG PO TABS
1.0000 | ORAL_TABLET | ORAL | Status: DC | PRN
  Administered 2018-02-17 – 2018-02-18 (×3): 1 via ORAL
  Administered 2018-02-18: 2 via ORAL
  Filled 2018-02-16: qty 1
  Filled 2018-02-16: qty 2
  Filled 2018-02-16 (×2): qty 1

## 2018-02-16 MED ORDER — PROPOFOL 10 MG/ML IV BOLUS
INTRAVENOUS | Status: DC | PRN
  Administered 2018-02-16 (×3): 20 mg via INTRAVENOUS

## 2018-02-16 MED ORDER — MENTHOL 3 MG MT LOZG
1.0000 | LOZENGE | OROMUCOSAL | Status: DC | PRN
  Filled 2018-02-16 (×2): qty 9

## 2018-02-16 MED ORDER — MAGNESIUM HYDROXIDE 400 MG/5ML PO SUSP
30.0000 mL | Freq: Every day | ORAL | Status: DC | PRN
Start: 2018-02-16 — End: 2018-02-18
  Administered 2018-02-17 – 2018-02-18 (×2): 30 mL via ORAL
  Filled 2018-02-16 (×2): qty 30

## 2018-02-16 MED ORDER — ENOXAPARIN SODIUM 30 MG/0.3ML ~~LOC~~ SOLN
30.0000 mg | SUBCUTANEOUS | Status: DC
  Administered 2018-02-17: 30 mg via SUBCUTANEOUS
  Filled 2018-02-16: qty 0.3

## 2018-02-16 MED ORDER — ACETAMINOPHEN 10 MG/ML IV SOLN
INTRAVENOUS | Status: DC | PRN
  Administered 2018-02-16: 1000 mg via INTRAVENOUS

## 2018-02-16 MED ORDER — FENTANYL CITRATE (PF) 100 MCG/2ML IJ SOLN
INTRAMUSCULAR | Status: DC | PRN
  Administered 2018-02-16: 25 ug via INTRAVENOUS

## 2018-02-16 MED ORDER — ONDANSETRON HCL 4 MG/2ML IJ SOLN: 4.0000 mg | Freq: Four times a day (QID) | INTRAMUSCULAR | Status: DC | PRN

## 2018-02-16 MED ORDER — FENTANYL CITRATE (PF) 100 MCG/2ML IJ SOLN
INTRAMUSCULAR | Status: AC
  Filled 2018-02-16: qty 2

## 2018-02-16 MED ORDER — BUPIVACAINE HCL (PF) 0.5 % IJ SOLN
INTRAMUSCULAR | Status: DC | PRN
  Administered 2018-02-16: 2.6 mL

## 2018-02-16 MED ORDER — ACETAMINOPHEN 10 MG/ML IV SOLN
INTRAVENOUS | Status: AC
  Filled 2018-02-16: qty 100

## 2018-02-16 SURGICAL SUPPLY — 36 items
BIT DRILL SPINE 4.0MMX260 (BIT) ×2
BIT DRILL SPINE 4.0X260 (BIT) ×4 IMPLANT
BLADE HELICAL TI 11.0X100 STRL (Orthopedic Implant) ×3 IMPLANT
BNDG COHESIVE 4X5 TAN STRL (GAUZE/BANDAGES/DRESSINGS) ×3 IMPLANT
CANISTER SUCT 1200ML W/VALVE (MISCELLANEOUS) ×3 IMPLANT
CHLORAPREP W/TINT 26ML (MISCELLANEOUS) ×6 IMPLANT
DRAPE C-ARMOR (DRAPES) IMPLANT
DRAPE INCISE 23X17 IOBAN STRL (DRAPES) ×2
DRAPE INCISE IOBAN 23X17 STRL (DRAPES) ×1 IMPLANT
DRSG AQUACEL AG ADV 3.5X10 (GAUZE/BANDAGES/DRESSINGS) ×3 IMPLANT
DRSG AQUACEL AG ADV 3.5X14 (GAUZE/BANDAGES/DRESSINGS) IMPLANT
ELECT REM PT RETURN 9FT ADLT (ELECTROSURGICAL) ×3
ELECTRODE REM PT RTRN 9FT ADLT (ELECTROSURGICAL) ×1 IMPLANT
GAUZE PETRO XEROFOAM 1X8 (MISCELLANEOUS) IMPLANT
GAUZE SPONGE 4X4 12PLY STRL (GAUZE/BANDAGES/DRESSINGS) ×3 IMPLANT
GLOVE INDICATOR 8.0 STRL GRN (GLOVE) ×3 IMPLANT
GLOVE SURG ORTHO 8.5 STRL (GLOVE) ×3 IMPLANT
GOWN STRL REUS W/ TWL LRG LVL3 (GOWN DISPOSABLE) ×1 IMPLANT
GOWN STRL REUS W/TWL LRG LVL3 (GOWN DISPOSABLE) ×2
GOWN STRL REUS W/TWL LRG LVL4 (GOWN DISPOSABLE) ×3 IMPLANT
GUIDEWIRE 3.2X400 (WIRE) ×6 IMPLANT
KIT TURNOVER KIT A (KITS) ×3 IMPLANT
MAT BLUE FLOOR 46X72 FLO (MISCELLANEOUS) ×3 IMPLANT
NAIL TROCH FX 11/130D 170-S (Nail) ×3 IMPLANT
NEEDLE SPNL 18GX3.5 QUINCKE PK (NEEDLE) ×3 IMPLANT
NS IRRIG 500ML POUR BTL (IV SOLUTION) ×3 IMPLANT
PACK HIP COMPR (MISCELLANEOUS) ×3 IMPLANT
REAMER ROD DEEP FLUTE 2.5X950 (INSTRUMENTS) IMPLANT
SCREW LOCK TI 5.0X38 F/IM NAIL (Screw) ×3 IMPLANT
STAPLER SKIN PROX 35W (STAPLE) ×3 IMPLANT
SUCTION FRAZIER HANDLE 10FR (MISCELLANEOUS) ×2
SUCTION TUBE FRAZIER 10FR DISP (MISCELLANEOUS) ×1 IMPLANT
SUT VIC AB 0 CT1 36 (SUTURE) ×3 IMPLANT
SUT VIC AB 2-0 CT1 27 (SUTURE) ×2
SUT VIC AB 2-0 CT1 TAPERPNT 27 (SUTURE) ×1 IMPLANT
SYR 30ML LL (SYRINGE) ×3 IMPLANT

## 2018-02-16 NOTE — Clinical Social Work Note (Signed)
Clinical Social Work Assessment  Patient Details  Name: Lisa Stokes MRN: [id number] Date of Birth: 01-23-1934  Date of referral:  02/16/18               Reason for consult:  Facility Placement                Permission sought to share information with:  Chartered certified accountant granted to share information::  Yes, Verbal Permission Granted  Name::        Agency::  [facility] area SNFs  Relationship::     Contact Information:     Housing/Transportation Living arrangements for the past 2 months:  [place] of Information:  Patient, Medical Team, Adult Children Patient Interpreter Needed:  None Criminal Activity/Legal Involvement Pertinent to Current Situation/Hospitalization:  No - Comment as needed Significant Relationships:  Adult Children Lives with:  Self Do you feel safe going back to the place where you live?  Yes Need for family participation in patient care:  No (Coment)  Care giving concerns:  Patient with hip fracture repair/possible need for STR   Social Worker assessment / plan:  The CSW met with the patient and her family at bedside to discuss discharge planning. The patient was in pain as evident by her constricted affect, verbalization of pain, and tearfulness. The patient's daughters agreed to speak with the CSW. The patient lives alone and would prefer to return home; however, the patient and the family are willing to pursue SNF should that be the recommendation. The CSW explained the process briefly including Medicare payment. The family gave permission to begin the referral.  The CSW will follow and make bed offers pending PT recommendation.   Employment status:  Retired Forensic scientist:  Medicare PT Recommendations:  Not assessed at this time [place] / Referral to community resources:  [place]  Patient/Family's Response to care:  The family thanked the CSW.  Patient/Family's  Understanding of and Emotional Response to Diagnosis, Current Treatment, and Prognosis:  The patient and family would like the patient to return home but are willing to consider STR should PT recommend such.  Emotional Assessment Appearance:  Appears stated age Attitude/Demeanor/Rapport:  Crying Affect (typically observed):  Constricted Orientation:  Oriented to Self, Oriented to Place, Oriented to  Time, Oriented to Situation Alcohol / Substance use:  Never Used Psych involvement (Current and /or in the community):  No (Comment)  Discharge Needs  Concerns to be addressed:  Care Coordination, Discharge Planning Concerns Readmission within the last 30 days:  No Current discharge risk:  None Barriers to Discharge:  Continued Medical Work up   [employer], LCSW 02/16/2018, 4:04 PM

## 2018-02-16 NOTE — Progress Notes (Signed)
[employer] [facility] [facility] [place] [facility]   PATIENT NAME: Lisa Stokes    MR#:  [id number]  DATE OF BIRTH:  May 07, 1934  SUBJECTIVE:   Patient here after a mechanical fall and noted to have a left hip fracture. Patient is status post left hip intramedullary pinning. Patient's family is at bedside. Patient denies any pain presently.  REVIEW OF SYSTEMS:    Review of Systems  Constitutional: Negative for chills and fever.  HENT: Negative for congestion and tinnitus.   Eyes: Negative for blurred vision and double vision.  Respiratory: Negative for cough, shortness of breath and wheezing.   Cardiovascular: Negative for chest pain, orthopnea and PND.  Gastrointestinal: Negative for abdominal pain, diarrhea, nausea and vomiting.  Genitourinary: Negative for dysuria and hematuria.  Neurological: Negative for dizziness, sensory change and focal weakness.  All other systems reviewed and are negative.   Nutrition: Regular  Tolerating Diet: Yes Tolerating PT: Await Eval.    DRUG ALLERGIES:   Allergies  Allergen Reactions  . Augmentin [Amoxicillin-Pot Clavulanate]   . Clindamycin/Lincomycin   . Demerol [Meperidine]   . Percocet [Oxycodone-Acetaminophen]     VITALS:  Blood pressure 120/67, pulse 64, temperature 98.7 F (37.1 C), temperature source Oral, resp. rate 18, height 5\' 4"  (1.626 m), weight 78 kg (172 lb), SpO2 96 %.  PHYSICAL EXAMINATION:   Physical Exam  GENERAL:  84 y.o.-year-old patient lying in bed in no acute distress.  EYES: Pupils equal, round, reactive to light and accommodation. No scleral icterus. Extraocular muscles intact.  HEENT: Head atraumatic, normocephalic. Oropharynx and nasopharynx clear.  NECK:  Supple, no jugular venous distention. No thyroid enlargement, no tenderness.  LUNGS: Normal breath sounds bilaterally, no wheezing, rales, rhonchi. No use of accessory muscles of respiration.  CARDIOVASCULAR: S1, S2 normal. No murmurs, rubs,  or gallops.  ABDOMEN: Soft, nontender, nondistended. Bowel sounds present. No organomegaly or mass.  EXTREMITIES: No cyanosis, clubbing or edema b/l.   Left Hip dressing in place with no bleeding.  NEUROLOGIC: Cranial nerves II through XII are intact. No focal Motor or sensory deficits b/l.   PSYCHIATRIC: The patient is alert and oriented x 3.  SKIN: No obvious rash, lesion, or ulcer.    LABORATORY PANEL:   CBC Recent Labs  Lab 02/16/18 0457  WBC 9.4  HGB 14.6  HCT 41.6  PLT 195   ------------------------------------------------------------------------------------------------------------------  Chemistries  Recent Labs  Lab 02/15/18 1545 02/16/18 0457  NA 136 133*  K 4.1 4.4  CL 104 101  CO2 22 24  GLUCOSE 139* 134*  BUN 22* 20  CREATININE 0.88 0.82  CALCIUM 9.0 8.4*  AST 28  --   ALT 27  --   ALKPHOS 68  --   BILITOT 1.0  --    ------------------------------------------------------------------------------------------------------------------  Cardiac Enzymes Recent Labs  Lab 02/15/18 1545  TROPONINI 0.03*   ------------------------------------------------------------------------------------------------------------------  RADIOLOGY:  Dg Chest Portable 1 View  Result Date: 02/15/2018 CLINICAL DATA:  Preop.  No history of heart and lung disease. EXAM: PORTABLE CHEST 1 VIEW COMPARISON:  Chest CT, 04/03/2010 FINDINGS: Cardiac silhouette is borderline enlarged. No mediastinal or hilar masses. No convincing adenopathy. There is prominence of the aortic arch. Clear lungs.  No pleural effusion or pneumothorax. Skeletal structures are demineralized but grossly intact. IMPRESSION: No acute cardiopulmonary disease. Electronically Signed   By: Amie Portlandavid  Ormond M.D.   On: 02/15/2018 16:58   Dg Hip Operative Unilat W Or W/o Pelvis Left  Result Date: 02/16/2018 CLINICAL DATA:  Left hip intramedullary nail placement. EXAM: OPERATIVE LEFT HIP (WITH PELVIS IF PERFORMED) 3 VIEWS  TECHNIQUE: Fluoroscopic spot image(s) were submitted for interpretation post-operatively. COMPARISON:  Plain films of 1 day prior FINDINGS: Three intraoperative images demonstrate placement of an intramedullary nail and screw fixation device. No hardware complication or new fractures identified. IMPRESSION: Intraoperative imaging of left proximal femoral fixation. Electronically Signed   By: Jeronimo Greaves M.D.   On: 02/16/2018 10:45   Dg Hip Unilat With Pelvis 2-3 Views Left  Result Date: 02/15/2018 CLINICAL DATA:  Fall, left hip pain. EXAM: DG HIP (WITH OR WITHOUT PELVIS) 2-3V LEFT COMPARISON:  None FINDINGS: There is a nondisplaced left femoral intertrochanteric fracture. No subluxation or dislocation. Mild symmetric degenerative changes in the hips bilaterally. IMPRESSION: Nondisplaced left femoral intertrochanteric fracture. Electronically Signed   By: Charlett Nose M.D.   On: 02/15/2018 16:18     ASSESSMENT AND PLAN:   84 year old female with past medical history of hypertension, anxiety who presented to the hospital after mechanical fall and noted to have a left hip fracture.  1. Status post fall and left hip fracture-this was a mechanical fall. Patient is status post left hip intramedullary pinning today. -Continue pain control and further care as per orthopedics. Await physical therapy evaluation.  2. Essential hypertension-continue atenolol.  3. Anxiety-continue Paxil.  All the records are reviewed and case discussed with Care Management/Social Worker. Management plans discussed with the patient, family and they are in agreement.  CODE STATUS: Full code  DVT Prophylaxis: Lovenox  TOTAL TIME TAKING CARE OF THIS PATIENT: 30 minutes.   POSSIBLE D/C IN 1-2 DAYS, DEPENDING ON CLINICAL CONDITION.   Houston Siren M.D on 02/16/2018 at 1:09 PM  Between 7am to 6pm - Pager - [phone]  After 6pm go to [email or web address] - Scientist, research (life sciences) [place] [facility]   Office  [phone]  CC: Primary care physician; Dione Housekeeper, MD

## 2018-02-16 NOTE — Op Note (Signed)
DATE OF SURGERY:  02/16/2018  TIME: 10:23 AM  PATIENT NAME:  Lisa CrazeAnne N Highfill  AGE: 84 y.o.  PRE-OPERATIVE DIAGNOSIS:  left hip fracture intertrochanteric  POST-OPERATIVE DIAGNOSIS:  SAME  PROCEDURE:  INTRAMEDULLARY (IM) NAIL INTERTROCHANTRIC left hip  SURGEON:  Sherran Margolis E  ASST:  EBL: 25 cc  COMPLICATIONS: None  OPERATIVE IMPLANTS: Synthes trochanteric femoral nail 130 degrees / 11 mm with interlocking helical blade 100 mm and distal locking screw 38 mm.  PREOPERATIVE INDICATIONS:  [name] is a 84 y.o. year old who fell and suffered a hip fracture. She was brought into the ER and then admitted and optimized and then elected for surgical intervention.    The risks benefits and alternatives were discussed with the patient including but not limited to the risks of nonoperative treatment, versus surgical intervention including infection, bleeding, nerve injury, malunion, nonunion, hardware prominence, hardware failure, need for hardware removal, blood clots, cardiopulmonary complications, morbidity, mortality, among others, and they were willing to proceed.    OPERATIVE PROCEDURE:  The patient was brought to the operating room and placed in the supine position.  Spinal anesthesia was administered, with a foley. She was placed on the fracture table.  Closed reduction was performed under C-arm guidance. The length of the femur was also measured using fluoroscopy. Time out was then performed after sterile prep and drape. She received preoperative antibiotics.  Incision was made proximal to the greater trochanter. A guidewire was placed in the appropriate position. Confirmation was made on AP and lateral views. The above-named nail was opened. I opened the proximal femur with a reamer. I then placed the nail by hand easily down. I did not need to ream the femur.  Once the nail was completely seated, I placed a guidepin into the femoral head into the center center  position through a second incision.  I measured the length, and then reamed the lateral cortex and up into the head. I then placed the helical blade. Slight compression was applied. Anatomic fixation achieved. Bone quality was mediocre.  I then secured the proximal interlock.  The distal locking screw was then placed and after confirming the position of the fracture fragments and hardware I then removed the instruments, and took final C-arm pictures AP and lateral the entire length of the leg. Anatomic reconstruction was achieved, and the wounds were irrigated copiously and closed with Vicryl  followed by staples and dry sterile dressing. Sponge and needle count were correct.   The patient was awakened and returned to PACU in stable and satisfactory condition. There no complications and the patient tolerated the procedure well.  She will be partial weightbearing as tolerated, and will be on Lovenox  For DVT prophylaxis.     Valinda HoarHoward E Imonie Tuch, M.D.

## 2018-02-16 NOTE — NC FL2 (Signed)
[place] MEDICAID FL2 LEVEL OF CARE SCREENING TOOL     IDENTIFICATION  Patient Name: Lisa Stokes Birthdate: 04-Aug-1934 Sex: female Admission Date (Current Location): 02/15/2018  [place] and [place] Number:  Chiropodist and Address:  [facility], [address], [place], [place] [address]      Provider Number: [id number]  Attending Physician Name and Address:  Houston Siren, MD  Relative Name and Phone Number:  Lisa Stokes [phone]) Daughter    Current Level of Care: Hospital Recommended Level of Care: Skilled Nursing Facility Prior Approval Number:    Date Approved/Denied: 02/16/18 PASRR Number: [id number]  Discharge Plan: SNF    Current Diagnoses: Patient Active Problem List   Diagnosis Date Noted  . Hip fracture (HCC) 02/15/2018    Orientation RESPIRATION BLADDER Height & Weight     Self, Time, Situation, Place  Normal Continent Weight: 172 lb (78 kg) Height:  5\' 4"  (162.6 cm)  BEHAVIORAL SYMPTOMS/MOOD NEUROLOGICAL BOWEL NUTRITION STATUS      Continent    AMBULATORY STATUS COMMUNICATION OF NEEDS Skin   Extensive Assist Verbally Surgical wounds                       Personal Care Assistance Level of Assistance  Bathing, Feeding, Dressing Bathing Assistance: Limited assistance Feeding assistance: Independent Dressing Assistance: Limited assistance     Functional Limitations Info  Sight, Hearing, Speech Sight Info: Adequate Hearing Info: Adequate Speech Info: Adequate    SPECIAL CARE FACTORS FREQUENCY  PT (By licensed PT)     PT Frequency: Up to 5X per day, 5 days per week              Contractures Contractures Info: Not present    Additional Factors Info  Code Status, Allergies Code Status Info: Full Allergies Info: Augmentin Amoxicillin-pot Clavulanate, Clindamycin/lincomycin, Demerol Meperidine, Percocet Oxycodone-acetaminophen           Current Medications (02/16/2018):  This  is the current hospital active medication list Current Facility-Administered Medications  Medication Dose Route Frequency Provider Last Rate Last Dose  . 0.45 % sodium chloride infusion   Intravenous Continuous Deeann Saint, MD 75 mL/hr at 02/16/18 1204    . 0.9 %  sodium chloride infusion   Intravenous Continuous Auburn Bilberry, MD 100 mL/hr at 02/16/18 0552    . [START ON 02/17/2018] acetaminophen (TYLENOL) tablet 325-650 mg  325-650 mg Oral Q6H PRN Deeann Saint, MD      . alum & mag hydroxide-simeth (MAALOX/MYLANTA) 200-200-20 MG/5ML suspension 30 mL  30 mL Oral Q4H PRN Deeann Saint, MD      . atenolol (TENORMIN) tablet 50 mg  50 mg Oral Daily Auburn Bilberry, MD   50 mg at 02/16/18 0743  . bisacodyl (DULCOLAX) suppository 10 mg  10 mg Rectal Daily PRN Deeann Saint, MD      . ceFAZolin (ANCEF) IVPB 2g/100 mL premix  2 g Intravenous Q8H Deeann Saint, MD   Stopped at 02/16/18 1356  . cyclobenzaprine (FLEXERIL) tablet 10 mg  10 mg Oral TID PRN Auburn Bilberry, MD   10 mg at 02/16/18 1518  . docusate sodium (COLACE) capsule 100 mg  100 mg Oral BID Deeann Saint, MD   100 mg at 02/16/18 1203  . [START ON 02/17/2018] enoxaparin (LOVENOX) injection 30 mg  30 mg Subcutaneous Q24H Deeann Saint, MD      . Melene Muller ON 02/17/2018] ferrous sulfate tablet 325 mg  325 mg Oral Q  breakfast Deeann SaintMiller, Howard, MD      . HYDROcodone-acetaminophen [facility](NORCO) 7.5-325 MG per tablet 1-2 tablet  1-2 tablet Oral Q4H PRN Deeann SaintMiller, Howard, MD      . HYDROcodone-acetaminophen (NORCO/VICODIN) 5-325 MG per tablet 1-2 tablet  1-2 tablet Oral Q4H PRN Deeann SaintMiller, Howard, MD   1 tablet at 02/16/18 1449  . LORazepam (ATIVAN) injection 1 mg  1 mg Intravenous Q4H PRN Auburn BilberryPatel, Shreyang, MD   1 mg at 02/16/18 1553  . magnesium hydroxide (MILK OF MAGNESIA) suspension 30 mL  30 mL Oral Daily PRN Deeann SaintMiller, Howard, MD      . menthol-cetylpyridinium (CEPACOL) lozenge 3 mg  1 lozenge Oral PRN Deeann SaintMiller, Howard, MD       Or  . phenol (CHLORASEPTIC)  mouth spray 1 spray  1 spray Mouth/Throat PRN Deeann SaintMiller, Howard, MD      . metoCLOPramide (REGLAN) tablet 5-10 mg  5-10 mg Oral Q8H PRN Deeann SaintMiller, Howard, MD       Or  . metoCLOPramide (REGLAN) injection 5-10 mg  5-10 mg Intravenous Q8H PRN Deeann SaintMiller, Howard, MD      . morphine 2 MG/ML injection 0.5-1 mg  0.5-1 mg Intravenous Q2H PRN Deeann SaintMiller, Howard, MD      . ondansetron [facility](ZOFRAN) tablet 4 mg  4 mg Oral Q6H PRN Deeann SaintMiller, Howard, MD       Or  . ondansetron [facility](ZOFRAN) injection 4 mg  4 mg Intravenous Q6H PRN Deeann SaintMiller, Howard, MD      . PARoxetine (PAXIL) tablet 20 mg  20 mg Oral Daily Auburn BilberryPatel, Shreyang, MD   Stopped at 02/16/18 1000  . sodium phosphate (FLEET) 7-19 GM/118ML enema 1 enema  1 enema Rectal Once PRN Deeann SaintMiller, Howard, MD      . traMADol Janean Sark(ULTRAM) tablet 50 mg  50 mg Oral Q6H Deeann SaintMiller, Howard, MD   50 mg at 02/16/18 1203  . zolpidem (AMBIEN) tablet 5 mg  5 mg Oral QHS PRN Deeann SaintMiller, Howard, MD         Discharge Medications: Please see discharge summary for a list of discharge medications.  Relevant Imaging Results:  Relevant Lab Results:   Additional Information SS# [id number]  Lisa CongKaren M Stellar Gensel, LCSW

## 2018-02-16 NOTE — H&P (Signed)
THE PATIENT WAS SEEN PRIOR TO SURGERY TODAY.  HISTORY, ALLERGIES, HOME MEDICATIONS AND OPERATIVE PROCEDURE WERE REVIEWED. RISKS AND BENEFITS OF SURGERY DISCUSSED WITH PATIENT AGAIN.  NO CHANGES FROM INITIAL HISTORY AND PHYSICAL NOTED.    

## 2018-02-16 NOTE — Anesthesia Procedure Notes (Signed)
Spinal  Patient location during procedure: OR Start time: 02/16/2018 9:17 AM Staffing Anesthesiologist: Piscitello, Precious Haws, MD Performed: anesthesiologist  Preanesthetic Checklist Completed: patient identified, site marked, surgical consent, pre-op evaluation, timeout performed, IV checked, risks and benefits discussed and monitors and equipment checked Spinal Block Patient position: sitting Prep: ChloraPrep Patient monitoring: heart rate, continuous pulse ox, blood pressure and cardiac monitor Approach: midline Location: L4-5 Injection technique: single-shot Needle Needle type: Whitacre, Introducer and Quincke  Needle gauge: 22 G Needle length: 9 cm Assessment Sensory level: T8 Additional Notes Negative paresthesia. Negative blood return. Positive free-flowing CSF. Expiration date of kit checked and confirmed. Patient tolerated procedure well, without complications.

## 2018-02-16 NOTE — Progress Notes (Signed)
IV pain meds given to patient. Family at bedside. IVF lnfusing. Continue to monitor.

## 2018-02-16 NOTE — Anesthesia Post-op Follow-up Note (Signed)
Anesthesia QCDR form completed.        

## 2018-02-16 NOTE — Transfer of Care (Signed)
Immediate Anesthesia Transfer of Care Note  Patient: Lisa Stokes  Procedure(s) Performed: INTRAMEDULLARY (IM) NAIL INTERTROCHANTRIC (Left )  Patient Location: PACU  Anesthesia Type:Spinal  Level of Consciousness: awake, alert  and oriented  Airway & Oxygen Therapy: Patient Spontanous Breathing and Patient connected to face mask oxygen  Post-op Assessment: Report given to RN and Post -op Vital signs reviewed and stable  Post vital signs: Reviewed and stable  Last Vitals:  Vitals:   02/16/18 0720 02/16/18 1030  BP: (!) 147/76 (!) 75/47  Pulse: 76 (!) 55  Resp: 17 19  Temp: 37 C 37 C  SpO2: 91% 98%    Last Pain:  Vitals:   02/16/18 1030  TempSrc:   PainSc: Asleep      Patients Stated Pain Goal: 3 (02/16/18 0742)  Complications: No apparent anesthesia complications

## 2018-02-16 NOTE — Progress Notes (Signed)
Patient c/o extreme muscle spasms. Dr Hyacinth Meekermiller notified, states to try ativan prn[facility] given[facility]

## 2018-02-16 NOTE — Progress Notes (Signed)
Patient back from OR. VSS. O2 2L. IVF infusing. Bucks traction reapplied. Family at bedside.

## 2018-02-16 NOTE — Anesthesia Procedure Notes (Addendum)
Date/Time: 02/16/2018 8:52 AM Performed by: Ginger CarneMichelet, Hope Holst, CRNA Pre-anesthesia Checklist: Patient identified, Emergency Drugs available, Suction available, Patient being monitored and Timeout performed Patient Re-evaluated:Patient Re-evaluated prior to induction Oxygen Delivery Method: Simple face mask

## 2018-02-16 NOTE — Anesthesia Preprocedure Evaluation (Signed)
Anesthesia Evaluation  Patient identified by MRN, date of birth, ID band Patient awake    Reviewed: Allergy & Precautions, H&P , NPO status , Patient's Chart, lab work & pertinent test results, reviewed documented beta blocker date and time   Airway Mallampati: II   Neck ROM: full    Dental  (+) Poor Dentition   Pulmonary neg pulmonary ROS,    Pulmonary exam normal        Cardiovascular hypertension, negative cardio ROS Normal cardiovascular exam Rhythm:regular Rate:Normal     Neuro/Psych negative neurological ROS  negative psych ROS   GI/Hepatic negative GI ROS, Neg liver ROS,   Endo/Other  negative endocrine ROS  Renal/GU negative Renal ROS  negative genitourinary   Musculoskeletal   Abdominal   Peds  Hematology negative hematology ROS (+)   Anesthesia Other Findings Past Medical History: No date: CRI (chronic renal insufficiency) No date: Heart murmur No date: HTN (hypertension) No date: Sclerosing adenosis of breast Past Surgical History: No date: HEMORRHOID SURGERY     Comment:  external No date: MASTECTOMY; Bilateral BMI    Body Mass Index:  29.52 kg/m     Reproductive/Obstetrics negative OB ROS                             Anesthesia Physical Anesthesia Plan  ASA: III and emergent  Anesthesia Plan: General and Spinal   Post-op Pain Management:    Induction:   PONV Risk Score and Plan:   Airway Management Planned:   Additional Equipment:   Intra-op Plan:   Post-operative Plan:   Informed Consent: I have reviewed the patients History and Physical, chart, labs and discussed the procedure including the risks, benefits and alternatives for the proposed anesthesia with the patient or authorized representative who has indicated his/her understanding and acceptance.   Dental Advisory Given  Plan Discussed with: CRNA  Anesthesia Plan Comments:          Anesthesia Quick Evaluation

## 2018-02-16 NOTE — Progress Notes (Signed)
Patient a&o, VSS. IVF infusing. Family at bedside. Traction in place. Surgery planned at 9 by Dr Hyacinth MeekerMiller. Continue to monitor.

## 2018-02-16 NOTE — Progress Notes (Signed)
Patient still complaining of muscle spasms. Dr Cherlynn KaiserSainani paged, give one time dose of baclofen. If this works better than flexeril, then d/c flexeril and start baclofen 5mg  TID prn muscle spasms. Order placed for one time baclofen.

## 2018-02-17 ENCOUNTER — Encounter: Payer: Self-pay | Admitting: Specialist

## 2018-02-17 LAB — CBC
HEMATOCRIT: 40.9 % (ref 35.0–47.0)
Hemoglobin: 14.1 g/dL (ref 12.0–16.0)
MCH: 31.1 pg (ref 26.0–34.0)
MCHC: 34.4 g/dL (ref 32.0–36.0)
MCV: 90.6 fL (ref 80.0–100.0)
Platelets: 176 10*3/uL (ref 150–440)
RBC: 4.52 MIL/uL (ref 3.80–5.20)
RDW: 13.1 % (ref 11.5–14.5)
WBC: 9.9 10*3/uL (ref 3.6–11.0)

## 2018-02-17 LAB — BASIC METABOLIC PANEL
Anion gap: 11 (ref 5–15)
BUN: 11 mg/dL (ref 6–20)
CALCIUM: 8.2 mg/dL — AB (ref 8.9–10.3)
CO2: 19 mmol/L — AB (ref 22–32)
CREATININE: 0.92 mg/dL (ref 0.44–1.00)
Chloride: 98 mmol/L — ABNORMAL LOW (ref 101–111)
GFR calc Af Amer: >60 mL/min (ref >60)
GFR calc non Af Amer: 57 mL/min — ABNORMAL LOW (ref >60)
GLUCOSE: 164 mg/dL — AB (ref 65–99)
Potassium: 3.9 mmol/L (ref 3.5–5.1)
Sodium: 128 mmol/L — ABNORMAL LOW (ref 135–145)

## 2018-02-17 MED ORDER — BACLOFEN 5 MG HALF TABLET
5.0000 mg | ORAL_TABLET | Freq: Three times a day (TID) | ORAL | Status: DC | PRN
Start: 2018-02-17 — End: 2018-02-18
  Administered 2018-02-17 – 2018-02-18 (×2): 5 mg via ORAL
  Filled 2018-02-17 (×3): qty 1

## 2018-02-17 MED ORDER — ENOXAPARIN SODIUM 40 MG/0.4ML ~~LOC~~ SOLN
40.0000 mg | SUBCUTANEOUS | Status: DC
  Administered 2018-02-18: 40 mg via SUBCUTANEOUS
  Filled 2018-02-17: qty 0.4

## 2018-02-17 MED ORDER — DOCUSATE SODIUM 100 MG PO CAPS
100.0000 mg | ORAL_CAPSULE | Freq: Two times a day (BID) | ORAL | Status: DC
  Administered 2018-02-17 – 2018-02-18 (×3): 100 mg via ORAL
  Filled 2018-02-17 (×3): qty 1

## 2018-02-17 NOTE — Evaluation (Signed)
Physical Therapy Evaluation Patient Details Name: Lisa Stokes MRN: [id number] DOB: Dec 26, 1933 Today's Date: 02/17/2018   History of Present Illness  Pt is a 84 y.o F, presented to hospital w/ L hip pain post mechanical fall. Pt admitted 02/15/18 for L femoral fracture. Pt  had surgery 02/16/18 L hip intramedullary pinning. Pt seen for PT eval POD #1.  PMHX includes chronic renal insufficiency, heart murmur, HTN, sclerosing adenosis of breast.    Clinical Impression  Pt demonstrated LLE strength at least 3/5 strength and L hip pain 8/10 w/ supine bed exercises.  Pt demonstrated ability to follow commands and cueing to transfer from supine to sit w/ min assist by therapist to move LLE from bed to floor. However, Pt reported dizziness and lightheadedness while sitting at edge of bed.  Therefore, transfers and ambulation were deferred at this time; will attempt at next PT session as tolerated.  Pt returned to supine position and symptoms resolved. Pt would benefit from skilled PT to promote optimal return to PLOF; recommend transition to SNF upon discharge from acute hospitalization.    Follow Up Recommendations SNF    Equipment Recommendations  Rolling walker with 5" wheels;3in1 (PT)    Recommendations for Other Services       Precautions / Restrictions Precautions Precautions: Fall Restrictions Weight Bearing Restrictions: Yes LLE Weight Bearing: Partial weight bearing LLE Partial Weight Bearing Percentage or Pounds: 50%      Mobility  Bed Mobility Overal bed mobility: Needs Assistance Bed Mobility: Supine to Sit     Supine to sit: HOB elevated;Min assist;Min guard Sit to supine: Mod assist   General bed mobility comments: Pt good technique and required min guard with scooting up in bed. Pt demonstrated good techniqued and required min assist with supine to sit transfer, requiring therapist to move LLE from bed to floor. However, Pt reported dizziness and fatigue sitting EOB  and required mod assist to return from sit to supine.  Transfers                    Ambulation/Gait                Stairs            Wheelchair Mobility    Modified Rankin (Stroke Patients Only)       Balance Overall balance assessment: Needs assistance Sitting-balance support: No upper extremity supported;Feet supported Sitting balance-Leahy Scale: Poor Sitting balance - Comments: Pt reported dizziness when sitting EOB and required support from therapist to sit upright. Pt able to follow commands and could assist with postural control when given vc's to lean forward and hold self up.  However, Pt fatigued quickly.  Pt was returned to supine due to dizziness and fatigue. Postural control: Posterior lean                                   Pertinent Vitals/Pain Pain Assessment: 0-10 Pain Score: 7  Pain Location: L hip(end of session) Pain Descriptors / Indicators: Aching Pain Intervention(s): Limited activity within patient's tolerance;Monitored during session;RN gave pain meds during session;Premedicated before session;Repositioned(PT took 1/2 dose prior to session and RN dispensed other 1/2 during session.)    Home Living Family/patient expects to be discharged to:: Skilled nursing facility Living Arrangements: Alone Available Help at Discharge: Family(2 daughters, [name] & [name]) Type of Home: House Home Access: Stairs to enter Entrance Stairs-Rails: Can reach both  Entrance Stairs-Number of Steps: 3 Home Layout: Two level;Able to live on main level with bedroom/bathroom Home Equipment: None      Prior Function Level of Independence: Independent         Comments: Pt was independent with all ADL's.  Daughters report that Pt did not drive very often.     Hand Dominance   Dominant Hand: Right    Extremity/Trunk Assessment   Upper Extremity Assessment Upper Extremity Assessment: Overall WFL for tasks assessed    Lower Extremity  Assessment Lower Extremity Assessment: LLE deficits/detail LLE Deficits / Details: Pt demonstrated at least 3/5 strength with supine L hip exercises. LLE: Unable to fully assess due to pain    Cervical / Trunk Assessment Cervical / Trunk Assessment: Kyphotic  Communication   Communication: No difficulties  Cognition Arousal/Alertness: Lethargic;Suspect due to medications Behavior During Therapy: [facility] for tasks assessed/performed Overall Cognitive Status: Within Functional Limits for tasks assessed                                 General Comments: Pt very pleasant, just appeared sleepy possibly due to medication.      General Comments  Pt agreeable to session.    Exercises Total Joint Exercises Ankle Circles/Pumps: Both;5 reps;Strengthening;AROM;Supine Quad Sets: AROM;Left;5 reps;Strengthening;Supine Gluteal Sets: AROM;Both;5 reps;Supine Heel Slides: Left;AAROM;Strengthening;5 reps;Supine Hip ABduction/ADduction: AAROM;Strengthening;Left;5 reps;Supine   Assessment/Plan    PT Assessment Patient needs continued PT services  PT Problem List Decreased strength;Decreased range of motion;Decreased activity tolerance;Decreased balance;Decreased mobility;Decreased coordination;Decreased knowledge of use of DME;Decreased safety awareness;Pain       PT Treatment Interventions DME instruction;Gait training;Stair training;Functional mobility training;Therapeutic activities;Therapeutic exercise;Balance training;Patient/family education    PT Goals (Current goals can be found in the Care Plan section)  Acute Rehab PT Goals Patient Stated Goal: I wan to be able to walk again. PT Goal Formulation: With patient Time For Goal Achievement: 03/03/18 Potential to Achieve Goals: Good    Frequency BID   Barriers to discharge        Co-evaluation               AM-PAC PT "6 Clicks" Daily Activity  Outcome Measure Difficulty turning over in bed (including adjusting  bedclothes, sheets and blankets)?: A Little Difficulty moving from lying on back to sitting on the side of the bed? : Unable Difficulty sitting down on and standing up from a chair with arms (e.g., wheelchair, bedside commode, etc,.)?: Unable Help needed moving to and from a bed to chair (including a wheelchair)?: Total Help needed walking in hospital room?: Total Help needed climbing 3-5 steps with a railing? : Total 6 Click Score: 8    End of Session Equipment Utilized During Treatment: Gait belt Activity Tolerance: Patient limited by fatigue;Patient limited by pain Patient left: in bed;with call bell/phone within reach;with bed alarm set;with family/visitor present;with nursing/sitter in room;with SCD's reapplied Nurse Communication: Mobility status(RN notified that there was only 1 SCD in room.) PT Visit Diagnosis: Unsteadiness on feet (R26.81);Other abnormalities of gait and mobility (R26.89);History of falling (Z91.81);Muscle weakness (generalized) (M62.81);Difficulty in walking, not elsewhere classified (R26.2);Pain Pain - Right/Left: Left Pain - part of body: Hip    Time: 0930-1010 PT Time Calculation (min) (ACUTE ONLY): 40 min   Charges:         PT G Codes:          Masaji Billups Salvatore Decent, DPT 02/17/2018, 12:52 PM

## 2018-02-17 NOTE — Progress Notes (Signed)
lovenox dose increased to 40mg  daily due to TBW >45kg and crcl >6330ml/min  Lisa Stokes D Lisa Stokes, Pharm.D, BCPS Clinical Pharmacist

## 2018-02-17 NOTE — Progress Notes (Signed)
[employer] - [place] at [facility]   PATIENT NAME: Lisa Stokes    MR#:  [id number]  DATE OF BIRTH:  1934/07/25  SUBJECTIVE:   Patient here after a mechanical fall and noted to have a left hip fracture. Patient is status post left hip intramedullary pinning POD # 1 today.  Had some leg/back spasms post surgery and improved with Baclofen.  Awaiting PT eval.  . Patient's family is at bedside. Pt. Complaining of some Left hip pain.   REVIEW OF SYSTEMS:    Review of Systems  Constitutional: Negative for chills and fever.  HENT: Negative for congestion and tinnitus.   Eyes: Negative for blurred vision and double vision.  Respiratory: Negative for cough, shortness of breath and wheezing.   Cardiovascular: Negative for chest pain, orthopnea and PND.  Gastrointestinal: Negative for abdominal pain, diarrhea, nausea and vomiting.  Genitourinary: Negative for dysuria and hematuria.  Musculoskeletal: Positive for joint pain (Left Hip).  Neurological: Negative for dizziness, sensory change and focal weakness.  All other systems reviewed and are negative.   Nutrition: Regular  Tolerating Diet: Yes Tolerating PT:  Eval noted.    DRUG ALLERGIES:   Allergies  Allergen Reactions  . Augmentin [Amoxicillin-Pot Clavulanate]   . Clindamycin/Lincomycin   . Demerol [Meperidine]   . Percocet [Oxycodone-Acetaminophen]     VITALS:  Blood pressure 114/66, pulse 77, temperature 98.7 F (37.1 C), temperature source Oral, resp. rate 20, height 5\' 4"  (1.626 m), weight 78 kg (172 lb), SpO2 92 %.  PHYSICAL EXAMINATION:   Physical Exam  GENERAL:  84 y.o.-year-old patient lying in bed in no acute distress.  EYES: Pupils equal, round, reactive to light and accommodation. No scleral icterus. Extraocular muscles intact.  HEENT: Head atraumatic, normocephalic. Oropharynx and nasopharynx clear.  NECK:  Supple, no jugular venous distention. No thyroid enlargement, no tenderness.   LUNGS: Normal breath sounds bilaterally, no wheezing, rales, rhonchi. No use of accessory muscles of respiration.  CARDIOVASCULAR: S1, S2 normal. No murmurs, rubs, or gallops.  ABDOMEN: Soft, nontender, nondistended. Bowel sounds present. No organomegaly or mass.  EXTREMITIES: No cyanosis, clubbing or edema b/l.   Left Hip dressing in place with no bleeding.  NEUROLOGIC: Cranial nerves II through XII are intact. No focal Motor or sensory deficits b/l.   PSYCHIATRIC: The patient is alert and oriented x 3.  SKIN: No obvious rash, lesion, or ulcer.    LABORATORY PANEL:   CBC Recent Labs  Lab 02/17/18 1016  WBC 9.9  HGB 14.1  HCT 40.9  PLT 176   ------------------------------------------------------------------------------------------------------------------  Chemistries  Recent Labs  Lab 02/15/18 1545  02/17/18 1016  NA 136   < > 128*  K 4.1   < > 3.9  CL 104   < > 98*  CO2 22   < > 19*  GLUCOSE 139*   < > 164*  BUN 22*   < > 11  CREATININE 0.88   < > 0.92  CALCIUM 9.0   < > 8.2*  AST 28  --   --   ALT 27  --   --   ALKPHOS 68  --   --   BILITOT 1.0  --   --    < > = values in this interval not displayed.   ------------------------------------------------------------------------------------------------------------------  Cardiac Enzymes Recent Labs  Lab 02/15/18 1545  TROPONINI 0.03*   ------------------------------------------------------------------------------------------------------------------  RADIOLOGY:  Dg Chest Portable 1 View  Result Date: 02/15/2018 CLINICAL DATA:  Preop.  No  history of heart and lung disease. EXAM: PORTABLE CHEST 1 VIEW COMPARISON:  Chest CT, 04/03/2010 FINDINGS: Cardiac silhouette is borderline enlarged. No mediastinal or hilar masses. No convincing adenopathy. There is prominence of the aortic arch. Clear lungs.  No pleural effusion or pneumothorax. Skeletal structures are demineralized but grossly intact. IMPRESSION: No acute  cardiopulmonary disease. Electronically Signed   By: Amie Portland M.D.   On: 02/15/2018 16:58   Dg Hip Operative Unilat W Or W/o Pelvis Left  Result Date: 02/16/2018 CLINICAL DATA:  Left hip intramedullary nail placement. EXAM: OPERATIVE LEFT HIP (WITH PELVIS IF PERFORMED) 3 VIEWS TECHNIQUE: Fluoroscopic spot image(s) were submitted for interpretation post-operatively. COMPARISON:  Plain films of 1 day prior FINDINGS: Three intraoperative images demonstrate placement of an intramedullary nail and screw fixation device. No hardware complication or new fractures identified. IMPRESSION: Intraoperative imaging of left proximal femoral fixation. Electronically Signed   By: Jeronimo Greaves M.D.   On: 02/16/2018 10:45   Dg Hip Unilat With Pelvis 2-3 Views Left  Result Date: 02/15/2018 CLINICAL DATA:  Fall, left hip pain. EXAM: DG HIP (WITH OR WITHOUT PELVIS) 2-3V LEFT COMPARISON:  None FINDINGS: There is a nondisplaced left femoral intertrochanteric fracture. No subluxation or dislocation. Mild symmetric degenerative changes in the hips bilaterally. IMPRESSION: Nondisplaced left femoral intertrochanteric fracture. Electronically Signed   By: Charlett Nose M.D.   On: 02/15/2018 16:18     ASSESSMENT AND PLAN:   84 year old female with past medical history of hypertension, anxiety who presented to the hospital after mechanical fall and noted to have a left hip fracture.  1. Status post fall and left hip fracture-this was a mechanical fall. Patient is status post left hip intramedullary pinning POD # 1.  -Continue pain control and further care as per orthopedics.  - seen by PT and they recommend SNF/STR and likely discharged in next 1-2 days.   2. Essential hypertension-continue atenolol.  3. Anxiety-continue Paxil.  4. Hyponatremia - mild and likely hypovolemic/Hypotonic in nature. - cont. IV fluids and will repeat in a.m.   5. Back/hip spasms- cont. Baclofen PRN. Improved since yesterday.   All the  records are reviewed and case discussed with Care Management/Social Worker. Management plans discussed with the patient, family and they are in agreement.  CODE STATUS: Full code  DVT Prophylaxis: Lovenox  TOTAL TIME TAKING CARE OF THIS PATIENT: 30 minutes.   POSSIBLE D/C IN 1-2 DAYS, DEPENDING ON CLINICAL CONDITION.   Houston Siren M.D on 02/17/2018 at 1:40 PM  Between 7am to 6pm - Pager - [phone]  After 6pm go to [email or web address] - Scientist, research (life sciences) [facility]  Office  [phone]  CC: Primary care physician; Dione Housekeeper, MD

## 2018-02-17 NOTE — Progress Notes (Signed)
Physical Therapy Treatment Patient Details Name: Lisa Stokes MRN: [id number] DOB: 09-08-1934 Today's Date: 02/17/2018    History of Present Illness Pt is a 84 y.o F, presented to hospital w/ L hip pain post mechanical fall. Pt admitted 02/15/18 for L femoral fracture. Pt  had surgery 02/16/18 L hip intramedullary pinning. Pt seen for PT eval POD #1.  PMHX includes chronic renal insufficiency, heart murmur, HTN, sclerosing adenosis of breast.    PT Comments    Pt required mod assist + 2 to perform sit to stand transfer and transfer to chair (see mobility for details).  Pt reported mild dizziness with sitting EOB and standing, which resolved within a couple of minutes.  Vitals were taken to evaluate orthostatic hypotension and Pt did not demonstrate hypotension.  Pt reports L hip pain 1/10 (at rest) which increases to 9/10) with movement, RN notified.  PT will focus on progressing transfers, ambulation, and activity tolerance at next session.    Follow Up Recommendations  SNF     Equipment Recommendations  Rolling walker with 5" wheels;3in1 (PT)    Recommendations for Other Services       Precautions / Restrictions Precautions Precautions: Fall Restrictions Weight Bearing Restrictions: Yes LLE Weight Bearing: Partial weight bearing LLE Partial Weight Bearing Percentage or Pounds: 50%    Mobility  Bed Mobility Overal bed mobility: Needs Assistance Bed Mobility: Supine to Sit     Supine to sit: HOB elevated;Min assist Sit to supine: Mod assist   General bed mobility comments: Pt demonstrated good balance and technique with transferring supine to sit, requiring min assist for therapist to move LLE from bed to floor and using pad to assist with shifting hips to be square on the EOB. Pt did report mild dizziness at EOB, but stated she could work through it and wanted to stand. Vitals were monitored and remained stable.  Transfers Overall transfer level: Needs  assistance Equipment used: Rolling walker (2 wheeled) Transfers: Sit to/from Stand Sit to Stand: Mod assist;From elevated surface;+2 physical assistance         General transfer comment: Pt required mod assist + 2 for balance with sit to stand, vc's required to scoot to EOB, place one hand on walker and use one hand to push off of bed. Therapists blocked both knees during stance. Pt remained standing for a few minutes for BP to be check and reported mild dizziness.  Pt stated she felt well enough to transfer from standing to chair.  Ambulation/Gait Ambulation/Gait assistance: Mod assist;+2 physical assistance Ambulation Distance (Feet): 4 Feet Assistive device: Rolling walker (2 wheeled) Gait Pattern/deviations: Step-to pattern;Decreased stance time - left;Decreased step length - left;Decreased step length - right Gait velocity: decreased   General Gait Details: Pt required mod assist +2 and required vc's to push through BUE to offweight LLE during stance phase.    Stairs            Wheelchair Mobility    Modified Rankin (Stroke Patients Only)       Balance Overall balance assessment: Needs assistance Sitting-balance support: No upper extremity supported;Feet supported Sitting balance-Leahy Scale: Fair Sitting balance - Comments: Pt demonstrated fair balance while sitting EOB and reaching within BOS. PT reported mild dizziness which resolved within a couple of minutes and patient stated she wanted to attempt standing. Postural control: Posterior lean   Standing balance-Leahy Scale: Poor Standing balance comment: Patient required RW +2 for standing balance.  Cognition Arousal/Alertness: Awake/alert Behavior During Therapy: WFL for tasks assessed/performed Overall Cognitive Status: Within Functional Limits for tasks assessed                                 General Comments: Pt A&O x 4      Exercises Total Joint  Exercises Ankle Circles/Pumps: Both;5 reps;Strengthening;AROM;Supine Quad Sets: AROM;Left;5 reps;Strengthening;Supine Gluteal Sets: AROM;Both;5 reps;Supine Short Arc Quad: AROM;Strengthening;Left;10 reps;Supine Heel Slides: Left;AAROM;Strengthening;5 reps;Supine Hip ABduction/ADduction: AAROM;Strengthening;Left;5 reps;Supine Straight Leg Raises: AAROM;5 reps;Strengthening;Left;Supine    General Comments  Pt agreeable to session.      Pertinent Vitals/Pain Pain Assessment: 0-10 Pain Score: 8  Pain Location: L hip(with supine bed exercises) Pain Descriptors / Indicators: Aching Pain Intervention(s): Limited activity within patient's tolerance;Monitored during session;Premedicated before session    Home Living Family/patient expects to be discharged to:: Skilled nursing facility Living Arrangements: Alone Available Help at Discharge: Family(2 daughters, [name] & [name]) Type of Home: House Home Access: Stairs to enter Entrance Stairs-Rails: Can reach both Home Layout: Two level;Able to live on main level with bedroom/bathroom Home Equipment: None      Prior Function Level of Independence: Independent      Comments: Pt was independent with all ADL's.  Daughters report that Pt did not drive very often.   PT Goals (current goals can now be found in the care plan section) Acute Rehab PT Goals Patient Stated Goal: I want to be able to walk again. PT Goal Formulation: With patient/family Time For Goal Achievement: 03/03/18 Potential to Achieve Goals: Good Progress towards PT goals: Progressing toward goals    Frequency    BID      PT Plan      Co-evaluation              AM-PAC PT "6 Clicks" Daily Activity  Outcome Measure  Difficulty turning over in bed (including adjusting bedclothes, sheets and blankets)?: A Little Difficulty moving from lying on back to sitting on the side of the bed? : Unable Difficulty sitting down on and standing up from a chair with arms  (e.g., wheelchair, bedside commode, etc,.)?: Unable Help needed moving to and from a bed to chair (including a wheelchair)?: A Lot Help needed walking in hospital room?: A Lot Help needed climbing 3-5 steps with a railing? : A Lot 6 Click Score: 11    End of Session Equipment Utilized During Treatment: Gait belt Activity Tolerance: Patient limited by fatigue;Patient limited by pain Patient left: in chair;with chair alarm set;with family/visitor present;with SCD's reapplied;with call bell/phone within reach(B heels elevated by pillow.) Nurse Communication: Mobility status;Weight bearing status(RN notified pain 9/10 with movement.) PT Visit Diagnosis: Unsteadiness on feet (R26.81);Other abnormalities of gait and mobility (R26.89);History of falling (Z91.81);Muscle weakness (generalized) (M62.81);Difficulty in walking, not elsewhere classified (R26.2);Pain Pain - Right/Left: Left Pain - part of body: Hip     Time: [id number] PT Time Calculation (min) (ACUTE ONLY): 45 min  Charges:  $Therapeutic Exercise: 8-22 mins $Therapeutic Activity: 8-22 mins                    G Codes:        Amiyah Shryock Mondrian-Pardue, SPT 02/17/2018, 4:20 PM

## 2018-02-17 NOTE — Clinical Social Work Placement (Signed)
   CLINICAL SOCIAL WORK PLACEMENT  NOTE  Date:  02/17/2018  Patient Details  Name: Lisa Stokes MRN: [id number] Date of Birth: 1934/05/22  Clinical Social Work is seeking post-discharge placement for this patient at the Skilled  Nursing Facility level of care (*CSW will initial, date and re-position this form in  chart as items are completed):  Yes   Patient/family provided with [facility] Clinical Social Work Department's list of facilities offering this level of care within the geographic area requested by the patient (or if unable, by the patient's family).  Yes   Patient/family informed of their freedom to choose among providers that offer the needed level of care, that participate in Medicare, Medicaid or managed care program needed by the patient, have an available bed and are willing to accept the patient.  Yes   Patient/family informed of 's ownership interest [facility] and [facility], as well as of the fact that they are under no obligation to receive care at these facilities.  PASRR submitted to EDS on 02/16/18     PASRR number received on 02/16/18     Existing PASRR number confirmed on       FL2 transmitted to all facilities in geographic area requested by pt/family on 02/16/18     FL2 transmitted to all facilities within larger geographic area on       Patient informed that his/her managed care company has contracts with or will negotiate with certain facilities, including the following:        Yes   Patient/family informed of bed offers received.  Patient [facility] bed at [facility]     Physician recommends and patient chooses bed at      Patient to be transferred to [facility] on  .  Patient to be transferred to facility by       Patient family notified on   of transfer.  Name of family member notified:        PHYSICIAN       Additional Comment:    _______________________________________________ Shanora Christensen, Darleen CrockerBailey M,  LCSW 02/17/2018, 3:01 PM

## 2018-02-17 NOTE — Progress Notes (Signed)
Subjective: Feeling better today.  Bed in chair.  Sodium down to 128.  Hemoglobin   stable. 1 Day Post-Op Procedure(s) (LRB): INTRAMEDULLARY (IM) NAIL INTERTROCHANTRIC (Left)    Patient reports pain as mild.  Objective: Dressing is dry.  Neurovascular status good distally.  Rotation is good.  VITALS:   Vitals:   02/17/18 1540 02/17/18 1611  BP: 117/71 129/69  Pulse: 97 74  Resp:    Temp:  98 F (36.7 C)  SpO2: 94% 96%    Neurologically intact ABD soft Neurovascular intact Sensation intact distally Intact pulses distally Dorsiflexion/Plantar flexion intact Incision: scant drainage  LABS Recent Labs    02/15/18 1545 02/16/18 0457 02/17/18 1016  HGB 16.3* 14.6 14.1  HCT 47.0 41.6 40.9  WBC 10.1 9.4 9.9  PLT 205 195 176    Recent Labs    02/15/18 1545 02/16/18 0457 02/17/18 1016  NA 136 133* 128*  K 4.1 4.4 3.9  BUN 22* 20 11  CREATININE 0.88 0.82 0.92  GLUCOSE 139* 134* 164*    Recent Labs    02/15/18 1545  INR 1.00     Assessment/Plan: 1 Day Post-Op Procedure(s) (LRB): INTRAMEDULLARY (IM) NAIL INTERTROCHANTRIC (Left)   Advance diet Up with therapy D/C IV fluids Discharge to SNF   EC ASA  325mg  bid  fpr 6 weels  RTC  2 weeks

## 2018-02-17 NOTE — Progress Notes (Signed)
PT is recommending SNF. Clinical Social Worker (CSW) met with patient and her daughter Opal Sidles was at bedside and her daughter Wells Guiles was on speaker phone. CSW presented SNF bed offers. They chose [facility]. Medicare bundle case manager Bethena Roys is aware of above. CSW contacted [facility] admissions coordinator at [place] and made him aware of above.   [employer], LCSW [phone]

## 2018-02-18 ENCOUNTER — Inpatient Hospital Stay: Payer: Medicare Other

## 2018-02-18 LAB — BASIC METABOLIC PANEL
Anion gap: 8 (ref 5–15)
BUN: 11 mg/dL (ref 6–20)
CO2: 26 mmol/L (ref 22–32)
Calcium: 8.2 mg/dL — ABNORMAL LOW (ref 8.9–10.3)
Chloride: 97 mmol/L — ABNORMAL LOW (ref 101–111)
Creatinine, Ser: 0.82 mg/dL (ref 0.44–1.00)
GFR calc Af Amer: >60 mL/min (ref >60)
GLUCOSE: 124 mg/dL — AB (ref 65–99)
POTASSIUM: 4.8 mmol/L (ref 3.5–5.1)
Sodium: 131 mmol/L — ABNORMAL LOW (ref 135–145)

## 2018-02-18 LAB — CBC
HEMATOCRIT: 37.6 % (ref 35.0–47.0)
Hemoglobin: 12.9 g/dL (ref 12.0–16.0)
MCH: 31.1 pg (ref 26.0–34.0)
MCHC: 34.2 g/dL (ref 32.0–36.0)
MCV: 90.8 fL (ref 80.0–100.0)
PLATELETS: 163 10*3/uL (ref 150–440)
RBC: 4.14 MIL/uL (ref 3.80–5.20)
RDW: 12.7 % (ref 11.5–14.5)
WBC: 9.2 10*3/uL (ref 3.6–11.0)

## 2018-02-18 MED ORDER — DOCUSATE SODIUM 100 MG PO CAPS: 100.0000 mg | ORAL_CAPSULE | Freq: Two times a day (BID) | ORAL | 0 refills | Status: DC | PRN

## 2018-02-18 MED ORDER — ENOXAPARIN SODIUM 40 MG/0.4ML ~~LOC~~ SOLN: 40.0000 mg | SUBCUTANEOUS | Status: DC

## 2018-02-18 MED ORDER — HYDROCODONE-ACETAMINOPHEN 5-325 MG PO TABS: 1.0000 | ORAL_TABLET | ORAL | 0 refills | Status: DC | PRN

## 2018-02-18 MED ORDER — POLYETHYLENE GLYCOL 3350 17 G PO PACK: 17.0000 g | PACK | Freq: Every day | ORAL | Status: DC | PRN

## 2018-02-18 MED ORDER — BACLOFEN 5 MG PO TABS: 5.0000 mg | ORAL_TABLET | Freq: Three times a day (TID) | ORAL | Status: DC | PRN

## 2018-02-18 NOTE — Progress Notes (Signed)
Report called to [facility]. Report taken by Kandis MannanJulia RN. EMS called for transportation.

## 2018-02-18 NOTE — Progress Notes (Signed)
Physical Therapy Treatment Patient Details Name: Lisa Stokes MRN: [id number] DOB: 17-Nov-1934 Today's Date: 02/18/2018    History of Present Illness Pt is a 84 y.o F, presented to hospital w/ L hip pain post mechanical fall. Pt admitted 02/15/18 for L femoral fracture. Pt  had surgery 02/16/18 L hip intramedullary pinning. Pt seen for PT eval POD #1.  PMHX includes chronic renal insufficiency, heart murmur, HTN, sclerosing adenosis of breast.    PT Comments    Pt required +2 mod assist w/ transfers and ambulation (see mobility for details), Pt fatigues quickly demonstrating perspiration and reporting increased LLE pain from 8/10 (at rest) to 10/10 (with movement). Vitals were monitored throughout session; BP dropped from 160/76 (sitting EOB) to 125/65 (standing), Pt reported that they felt okay and wanted to move to the chair.  Pt required +2 mod assist to transfer safely from standing to chair. Pt required rest breaks during session in order to cool down and for dizziness symptoms to resolve.   Pt reports new pain at L ankle, therapist did not note any bruising on L ankle, but did note L ankle TTP with 10/10 pain, RN notified.  PT will focus on increasing functional mobility and activity tolerance at next session.   Follow Up Recommendations  SNF     Equipment Recommendations  Rolling walker with 5" wheels;3in1 (PT)    Recommendations for Other Services       Precautions / Restrictions Precautions Precautions: Fall Restrictions Weight Bearing Restrictions: Yes LLE Weight Bearing: Partial weight bearing LLE Partial Weight Bearing Percentage or Pounds: 50%    Mobility  Bed Mobility Overal bed mobility: Needs Assistance Bed Mobility: Supine to Sit     Supine to sit: HOB elevated;Min assist     General bed mobility comments: Pt demonstrated good balance and technique with sitting up with HOB, requiring min assist for therapist to move LLE from bed to floor. Pt was able to  adjust hips square to the EOB on her own.  Pt reported mild dizziness and exhibited perspiration, Pt required a rest break before attempting to stand. Pt stated symptoms were some better and wanted to stand.  Transfers Overall transfer level: Needs assistance Equipment used: Rolling walker (2 wheeled) Transfers: Sit to/from Stand Sit to Stand: Mod assist;From elevated surface;+2 physical assistance         General transfer comment: Pt required +2 mod assist for stability with sit to stand, vc's required to bring R foot back, push off with one hand on bed and the other in middle bar of RW. Therapist blocked L knee. Pt stood for a few minutes for BP to be monitored and reported mild dizziness.  BP did drop from 160/76 sitting to 125/65 standing; Pt was asked if they needed to sit down and Pt replied, "I feel ok and I would like to move into the chair."  Therapists assited Pt with safe transfer to chair.  Ambulation/Gait Ambulation/Gait assistance: Mod assist;+2 physical assistance Ambulation Distance (Feet): 4 Feet Assistive device: Rolling walker (2 wheeled) Gait Pattern/deviations: Step-to pattern;Decreased step length - left;Decreased stance time - left Gait velocity: decreased   General Gait Details: Pt required mod assist +2 and required vc's to push through BUE on RW to offweight LLE during stance phase, keep walker close when backing up, and to sit slowly.   Stairs            Wheelchair Mobility    Modified Rankin (Stroke Patients Only)  Balance Overall balance assessment: Needs assistance Sitting-balance support: No upper extremity supported;Feet supported Sitting balance-Leahy Scale: Good Sitting balance - Comments: Pt demonstrated good balance while sitting EOB and reaching within BOS to manage clothing.  Pt reported mild dizziness and demonstrated perspiration which resolved with a rest break.     Standing balance-Leahy Scale: Poor Standing balance comment:  Patient required RW +2 for standing balance.                            Cognition Arousal/Alertness: Awake/alert Behavior During Therapy: WFL for tasks assessed/performed Overall Cognitive Status: Within Functional Limits for tasks assessed                                 General Comments: Pt A&O x 4      Exercises      General Comments  Pt agreeable to session.      Pertinent Vitals/Pain Pain Assessment: 0-10 Pain Score: 10-Worst pain ever Pain Location: L hip and L ankle(during transfer, sit to stand) Pain Descriptors / Indicators: Aching;Grimacing;Guarding;Constant Pain Intervention(s): Limited activity within patient's tolerance;Monitored during session;Premedicated before session    Home Living                      Prior Function            PT Goals (current goals can now be found in the care plan section) Acute Rehab PT Goals Patient Stated Goal: I want to be able to walk again. PT Goal Formulation: With patient/family Time For Goal Achievement: 03/03/18 Potential to Achieve Goals: Good Progress towards PT goals: Progressing toward goals    Frequency    BID      PT Plan      Co-evaluation              AM-PAC PT "6 Clicks" Daily Activity  Outcome Measure  Difficulty turning over in bed (including adjusting bedclothes, sheets and blankets)?: A Little Difficulty moving from lying on back to sitting on the side of the bed? : Unable Difficulty sitting down on and standing up from a chair with arms (e.g., wheelchair, bedside commode, etc,.)?: Unable Help needed moving to and from a bed to chair (including a wheelchair)?: Total Help needed walking in hospital room?: Total Help needed climbing 3-5 steps with a railing? : Total 6 Click Score: 8    End of Session Equipment Utilized During Treatment: Gait belt Activity Tolerance: Patient limited by fatigue;Patient limited by pain Patient left: in chair;with call  bell/phone within reach;with chair alarm set;with family/visitor present;with SCD's reapplied(B heels elevated by pillow. R SCD applied and L SCD not applied per Pt request due to L ankle pain 8/10 and report of worsening L ankle pain 10/10 when L ankle SCD activates.) Nurse Communication: Mobility status;Other (comment)(RN notified that L SCD was not applied due to L ankle pain, L ankle and hip pain 8/10) PT Visit Diagnosis: Unsteadiness on feet (R26.81);Other abnormalities of gait and mobility (R26.89);History of falling (Z91.81);Muscle weakness (generalized) (M62.81);Difficulty in walking, not elsewhere classified (R26.2);Pain Pain - Right/Left: Left Pain - part of body: Hip;Ankle and joints of foot     Time: [id number] PT Time Calculation (min) (ACUTE ONLY): 55 min  Charges:                       G Codes:  Yatzary Merriweather Mondrian-Pardue, SPT 02/18/2018, 12:54 PM

## 2018-02-18 NOTE — Clinical Social Work Placement (Signed)
   CLINICAL SOCIAL WORK PLACEMENT  NOTE  Date:  02/18/2018  Patient Details  Name: Epimenio Footnne N Stuber MRN: [id number] Date of Birth: Sep 23, 1934  Clinical Social Work is seeking post-discharge placement for this patient at the Skilled  Nursing Facility level of care (*CSW will initial, date and re-position this form in  chart as items are completed):  Yes   Patient/family provided with [facility] Clinical Social Work Department's list of facilities offering this level of care within the geographic area requested by the patient (or if unable, by the patient's family).  Yes   Patient/family informed of their freedom to choose among providers that offer the needed level of care, that participate in Medicare, Medicaid or managed care program needed by the patient, have an available bed and are willing to accept the patient.  Yes   Patient/family informed of [facility]'s ownership interest in [facility] and [facility], as well as of the fact that they are under no obligation to receive care at these facilities.  PASRR submitted to EDS on 02/16/18     PASRR number received on 02/16/18     Existing PASRR number confirmed on       FL2 transmitted to all facilities in geographic area requested by pt/family on 02/16/18     FL2 transmitted to all facilities within larger geographic area on       Patient informed that his/her managed care company has contracts with or will negotiate with certain facilities, including the following:        Yes   Patient/family informed of bed offers received.  Patient chooses bed at [facility] )     Physician recommends and patient chooses bed at      Patient to be transferred to [facility] ) on 02/18/18.  Patient to be transferred to facility by [facility] )     Patient family notified on 02/18/18 of transfer.  Name of family member notified:  (Patient's daughter [name] is at bedside and aware of D/C today. )     PHYSICIAN        Additional Comment:    _______________________________________________ Azelie Noguera, Darleen CrockerBailey M, LCSW 02/18/2018, 12:04 PM

## 2018-02-18 NOTE — Progress Notes (Signed)
Patient is medically stable for D/C to [facility] today. Per Rick admissions coordinator at [facility] patient can come today to room E-6. RN will call [facility] and [facility]. Clinical Child psychotherapistocial Worker (CSW) [facility] via [employer]. Patient is aware of above. Patient's daughter [name] is at bedside and aware of above. Medicare bundle case manager [name] is aware of above. Please reconsult if future social work needs arise. CSW signing off.   [employer] Hughes IncorporatedBailey Haydon Dorris, LCSW [phone]

## 2018-02-18 NOTE — Discharge Summary (Signed)
[employer] [facility] [place] at [facility]   PATIENT NAME: Lisa Stokes    MR#:  [id number]  DATE OF BIRTH:  09/17/1934  DATE OF ADMISSION:  02/15/2018 ADMITTING PHYSICIAN: Auburn Bilberry, MD  DATE OF DISCHARGE: 02/18/2018  PRIMARY CARE PHYSICIAN: Dione Housekeeper, MD    ADMISSION DIAGNOSIS:  Closed fracture of left hip, initial encounter (HCC) [S72.002A] Fall, initial encounter [W19.XXXA]  DISCHARGE DIAGNOSIS:  Active Problems:   Hip fracture (HCC)   SECONDARY DIAGNOSIS:   Past Medical History:  Diagnosis Date  . CRI (chronic renal insufficiency)   . Heart murmur   . HTN (hypertension)   . Sclerosing adenosis of breast     HOSPITAL COURSE:   84 year old female with past medical history of hypertension, anxiety who presented to the hospital after mechanical fall and noted to have a left hip fracture.  1. Status post fall and left hip fracture-this was a mechanical fall. Patient is status post left hip intramedullary pinning POD # 2.  Patient's pain is well controlled with oral Vicodin, and she is tolerating physical therapy well. She is being discharged to a short-term rehabilitation for further care. -Patient will follow-up with orthopedics in the next 2 weeks. -Patient will continue Lovenox for DVT prophylaxis.    2. Essential hypertension- She will continue atenolol, Lisinopril.  3. Anxiety- she will continue Paxil.  4. Hyponatremia - mild and likely hypovolemic/Hypotonic in nature. - improved with IV fluid hydration.   5. Back/hip spasms- cont. Baclofen PRN. Improved since yesterday.   D/C to SNF today.    DISCHARGE CONDITIONS:   Stable.   CONSULTS OBTAINED:  Treatment Team:  Deeann Saint, MD  DRUG ALLERGIES:   Allergies  Allergen Reactions  . Augmentin [Amoxicillin-Pot Clavulanate]   . Clindamycin/Lincomycin   . Demerol [Meperidine]   . Percocet [Oxycodone-Acetaminophen]     DISCHARGE MEDICATIONS:   Allergies as of  02/18/2018      Reactions   Augmentin [amoxicillin-pot Clavulanate]    Clindamycin/lincomycin    Demerol [meperidine]    Percocet [oxycodone-acetaminophen]       Medication List    TAKE these medications   atenolol 50 MG tablet Commonly known as:  TENORMIN TAKE 1 TABLET BY MOUTH EACH DAY   Baclofen 5 MG Tabs Take 5 mg by mouth 3 (three) times daily as needed for muscle spasms.   docusate sodium 100 MG capsule Commonly known as:  COLACE Take 1 capsule (100 mg total) by mouth 2 (two) times daily as needed for mild constipation.   enoxaparin 40 MG/0.4ML injection Commonly known as:  LOVENOX Inject 0.4 mLs (40 mg total) into the skin daily for 14 days. Start taking on:  02/19/2018   HYDROcodone-acetaminophen 5-325 MG tablet Commonly known as:  NORCO/VICODIN Take 1-2 tablets by mouth every 4 (four) hours as needed for moderate pain (pain score 4-6).   lisinopril 40 MG tablet Commonly known as:  PRINIVIL,ZESTRIL Take 40 mg by mouth daily. for blood pressure   PARoxetine 20 MG tablet Commonly known as:  PAXIL Take 20 mg by mouth daily.         DISCHARGE INSTRUCTIONS:   DIET:  Cardiac diet  DISCHARGE CONDITION:  Stable  ACTIVITY:  Activity as tolerated  OXYGEN:  Home Oxygen: No.   Oxygen Delivery: room air  DISCHARGE LOCATION:  nursing home   If you experience worsening of your admission symptoms, develop shortness of breath, life threatening emergency, suicidal or homicidal thoughts you must seek medical attention immediately by  calling 911 or calling your MD immediately  if symptoms less severe.  You Must read complete instructions/literature along with all the possible adverse reactions/side effects for all the Medicines you take and that have been prescribed to you. Take any new Medicines after you have completely understood and accpet all the possible adverse reactions/side effects.   Please note  You were cared for by a hospitalist during your hospital  stay. If you have any questions about your discharge medications or the care you received while you were in the hospital after you are discharged, you can call the unit and asked to speak with the hospitalist on call if the hospitalist that took care of you is not available. Once you are discharged, your primary care physician will handle any further medical issues. Please note that NO REFILLS for any discharge medications will be authorized once you are discharged, as it is imperative that you return to your primary care physician (or establish a relationship with a primary care physician if you do not have one) for your aftercare needs so that they can reassess your need for medications and monitor your lab values.     Today   Patient complaining of some left ankle pain. We'll get x-ray today. Tolerating physical therapy well. Sodium improved to IV fluids. Patient to get suppository/enema to help with bowel movement today. Daughter is at bedside and is okay with discharge to rehabilitation later today.  VITAL SIGNS:  Blood pressure 134/68, pulse 91, temperature 98.4 F (36.9 C), temperature source Oral, resp. rate 20, height 5\' 4"  (1.626 m), weight 78 kg (172 lb), SpO2 97 %.  I/O:    Intake/Output Summary (Last 24 hours) at 02/18/2018 1144 Last data filed at 02/18/2018 0900 Gross per 24 hour  Intake 720 ml  Output 1100 ml  Net -380 ml    PHYSICAL EXAMINATION:   GENERAL:  84 y.o.-year-old patient lying in bed in no acute distress.  EYES: Pupils equal, round, reactive to light and accommodation. No scleral icterus. Extraocular muscles intact.  HEENT: Head atraumatic, normocephalic. Oropharynx and nasopharynx clear.  NECK:  Supple, no jugular venous distention. No thyroid enlargement, no tenderness.  LUNGS: Normal breath sounds bilaterally, no wheezing, rales, rhonchi. No use of accessory muscles of respiration.  CARDIOVASCULAR: S1, S2 normal. No murmurs, rubs, or gallops.  ABDOMEN:  Soft, nontender, nondistended. Bowel sounds present. No organomegaly or mass.  EXTREMITIES: No cyanosis, clubbing or edema b/l.   Left Hip dressing in place with no bleeding.  NEUROLOGIC: Cranial nerves II through XII are intact. No focal Motor or sensory deficits b/l.   PSYCHIATRIC: The patient is alert and oriented x 3.  SKIN: No obvious rash, lesion, or ulcer.     DATA REVIEW:   CBC Recent Labs  Lab 02/18/18 0426  WBC 9.2  HGB 12.9  HCT 37.6  PLT 163    Chemistries  Recent Labs  Lab 02/15/18 1545  02/18/18 0426  NA 136   < > 131*  K 4.1   < > 4.8  CL 104   < > 97*  CO2 22   < > 26  GLUCOSE 139*   < > 124*  BUN 22*   < > 11  CREATININE 0.88   < > 0.82  CALCIUM 9.0   < > 8.2*  AST 28  --   --   ALT 27  --   --   ALKPHOS 68  --   --   BILITOT 1.0  --   --    < > =  values in this interval not displayed.    Cardiac Enzymes Recent Labs  Lab 02/15/18 1545  TROPONINI 0.03*    Microbiology Results  Results for orders placed or performed during the hospital encounter of 02/15/18  Surgical PCR screen     Status: None   Collection Time: 02/15/18  6:25 PM  Result Value Ref Range Status   MRSA, PCR NEGATIVE NEGATIVE Final   Staphylococcus aureus NEGATIVE NEGATIVE Final    Comment: (NOTE) The Xpert SA Assay (FDA approved for NASAL specimens in patients 38 years of age and older), is one component of a comprehensive surveillance program. It is not intended to diagnose infection nor to guide or monitor treatment. Performed at [facility], [address], [place], [place] [address]     RADIOLOGY:  No results found.    Management plans discussed with the patient, family and they are in agreement.  CODE STATUS:     Code Status Orders  (From admission, onward)        Start     Ordered   02/15/18 1818  Full code  Continuous     02/15/18 1817    Code Status History    Date Active Date Inactive Code Status Order ID Comments User Context    This patient has a current code status but no historical code status.      TOTAL TIME TAKING CARE OF THIS PATIENT: 40 minutes.    Houston Siren M.D on 02/18/2018 at 11:44 AM  Between 7am to 6pm - Pager - [phone]  After 6pm go to [email or web address] - Scientist, research (life sciences) [facility]  Office  [phone]  CC: Primary care physician; Dione Housekeeper, MD

## 2018-02-19 ENCOUNTER — Emergency Department
Admission: EM | Admit: 2018-02-19 | Discharge: 2018-02-19 | Disposition: A | Discharge status: Skilled Nursing Facility | Payer: Medicare Other | Attending: Emergency Medicine | Admitting: Emergency Medicine

## 2018-02-19 ENCOUNTER — Emergency Department: Payer: Medicare Other

## 2018-02-19 ENCOUNTER — Encounter: Payer: Self-pay | Admitting: Emergency Medicine

## 2018-02-19 ENCOUNTER — Other Ambulatory Visit: Payer: Self-pay

## 2018-02-19 DIAGNOSIS — W06XXXA Fall from bed, initial encounter: Secondary | ICD-10-CM | POA: Diagnosis not present

## 2018-02-19 DIAGNOSIS — Y92122 Bedroom in nursing home as the place of occurrence of the external cause: Secondary | ICD-10-CM | POA: Diagnosis not present

## 2018-02-19 DIAGNOSIS — M25552 Pain in left hip: Secondary | ICD-10-CM | POA: Diagnosis not present

## 2018-02-19 DIAGNOSIS — Z8679 Personal history of other diseases of the circulatory system: Secondary | ICD-10-CM | POA: Insufficient documentation

## 2018-02-19 DIAGNOSIS — Y939 Activity, unspecified: Secondary | ICD-10-CM | POA: Diagnosis not present

## 2018-02-19 DIAGNOSIS — Z79899 Other long term (current) drug therapy: Secondary | ICD-10-CM | POA: Diagnosis not present

## 2018-02-19 DIAGNOSIS — Z8781 Personal history of (healed) traumatic fracture: Secondary | ICD-10-CM | POA: Diagnosis not present

## 2018-02-19 DIAGNOSIS — Y998 Other external cause status: Secondary | ICD-10-CM | POA: Diagnosis not present

## 2018-02-19 DIAGNOSIS — I10 Essential (primary) hypertension: Secondary | ICD-10-CM | POA: Insufficient documentation

## 2018-02-19 DIAGNOSIS — L7622 Postprocedural hemorrhage and hematoma of skin and subcutaneous tissue following other procedure: Secondary | ICD-10-CM | POA: Diagnosis not present

## 2018-02-19 DIAGNOSIS — M545 Low back pain: Secondary | ICD-10-CM | POA: Insufficient documentation

## 2018-02-19 MED ORDER — MORPHINE SULFATE (PF) 2 MG/ML IV SOLN
INTRAVENOUS | Status: AC
  Filled 2018-02-19: qty 1

## 2018-02-19 MED ORDER — ENOXAPARIN SODIUM 40 MG/0.4ML ~~LOC~~ SOLN
40.0000 mg | SUBCUTANEOUS | Status: DC
  Administered 2018-02-19: 40 mg via SUBCUTANEOUS
  Filled 2018-02-19: qty 0.4

## 2018-02-19 MED ORDER — MORPHINE SULFATE (PF) 2 MG/ML IV SOLN
2.0000 mg | Freq: Once | INTRAVENOUS | Status: AC
  Administered 2018-02-19: 2 mg via INTRAVENOUS

## 2018-02-19 MED ORDER — OXYCODONE HCL 5 MG PO TABS
5.0000 mg | ORAL_TABLET | ORAL | Status: DC | PRN
  Administered 2018-02-19: 5 mg via ORAL
  Filled 2018-02-19: qty 1

## 2018-02-19 MED ORDER — MORPHINE SULFATE (PF) 2 MG/ML IV SOLN
INTRAVENOUS | Status: AC
  Administered 2018-02-19: 2 mg via INTRAVENOUS
  Filled 2018-02-19: qty 1

## 2018-02-19 MED ORDER — ACETAMINOPHEN 500 MG PO TABS
1000.0000 mg | ORAL_TABLET | Freq: Three times a day (TID) | ORAL | Status: DC
  Administered 2018-02-19: 1000 mg via ORAL
  Filled 2018-02-19: qty 2

## 2018-02-19 MED ORDER — ATENOLOL 25 MG PO TABS
50.0000 mg | ORAL_TABLET | Freq: Every day | ORAL | Status: DC
  Administered 2018-02-19: 50 mg via ORAL
  Filled 2018-02-19: qty 2

## 2018-02-19 MED ORDER — ONDANSETRON HCL 4 MG/2ML IJ SOLN
INTRAMUSCULAR | Status: AC
  Filled 2018-02-19: qty 2

## 2018-02-19 MED ORDER — LISINOPRIL 10 MG PO TABS
40.0000 mg | ORAL_TABLET | Freq: Every day | ORAL | Status: DC
  Administered 2018-02-19: 40 mg via ORAL
  Filled 2018-02-19: qty 4

## 2018-02-19 MED ORDER — BACLOFEN 10 MG PO TABS
5.0000 mg | ORAL_TABLET | Freq: Three times a day (TID) | ORAL | Status: DC | PRN
  Administered 2018-02-19: 5 mg via ORAL
  Filled 2018-02-19: qty 0.5

## 2018-02-19 MED ORDER — ONDANSETRON HCL 4 MG/2ML IJ SOLN
4.0000 mg | Freq: Once | INTRAMUSCULAR | Status: AC
  Administered 2018-02-19: 4 mg via INTRAVENOUS

## 2018-02-19 NOTE — ED Provider Notes (Signed)
-----------------------------------------   9:58 AM on 02/19/2018 -----------------------------------------   Blood pressure 126/72, pulse 83, temperature 98.9 F (37.2 C), temperature source Oral, resp. rate 16, height 5\' 4"  (1.626 m), weight 80.7 kg (178 lb), SpO2 96 %.  Assuming care from Dr. Manson PasseyBrown of Epimenio Footnne N Sadek is a 84 y.o. female with a chief complaint of Hip Pain and Post-op Problem .    Please refer to H&P by previous MD for further details.  The current plan of care is to SW consult for placement at a different facility.  SW has found a bed a [facility]. Patient and family are happy with this assignment. The patient is stable for discharge.        Nita SickleVeronese, [place], MD 02/19/18 [phone]

## 2018-02-19 NOTE — Progress Notes (Signed)
[facility] Per patient's daughter [name] patient fell [facility] and laid on the floor for 1 hour and called 911 herself. [name] requested a new facility with a private room. [facility] offered a bed. Patient and daughters [name] and Lurena JoinerRebecca accepted the bed. CSW emphasized to daughters that [facility] can't prevent patient falling out of bed if she is confused. Daughters verbalized their understanding and stated they will look into hiring a Engineer, siteprivate sitter.   Patient will [facility] from the ED to room 314. RN will call report at [phone](336) [phone] and arrange EMS for transport. Clinical Child psychotherapistocial Worker (CSW) sent D/C summary and FL2 from yesterday to [facility] via [employer]. Medicare bundle case manager is aware of above. [facility] he will shred the norco prescription that was sent with patient yesterday. Hospitalist signed norco prescription today to [facility]. Please reconsult if future social work needs arise. CSW signing off.   [employer]Bailey Samy Ryner, LCSW [phone]) [phone]

## 2018-02-19 NOTE — ED Provider Notes (Signed)
[facility] Emergency Department Provider Note   First MD Initiated Contact with Patient 02/19/18 0400     (approximate)  I have reviewed the triage vital signs and the nursing notes.   HISTORY  Chief Complaint Hip Pain and Post-op Problem   HPI Lisa Stokes is a 84 y.o. female with history of recent left hip fracture status post repair on 02/16/2018 by Dr. Hyacinth Meeker presents to the emergency department from [facility] following rolling out of bed.  Patient admits to 9 out of 10 left hip pain, low back pain and bleeding saturating overlying dressing.  She denies any head injury no loss of consciousness   Past Medical History:  Diagnosis Date  . CRI (chronic renal insufficiency)   . Heart murmur   . HTN (hypertension)   . Sclerosing adenosis of breast     Patient Active Problem List   Diagnosis Date Noted  . Hip fracture (HCC) 02/15/2018    Past Surgical History:  Procedure Laterality Date  . HEMORRHOID SURGERY     external  . INTRAMEDULLARY (IM) NAIL INTERTROCHANTERIC Left 02/16/2018   Procedure: INTRAMEDULLARY (IM) NAIL INTERTROCHANTRIC;  Surgeon: Deeann Saint, MD;  Location: [facility] ORS;  Service: Orthopedics;  Laterality: Left;  . JOINT REPLACEMENT    . MASTECTOMY Bilateral     Prior to Admission medications   Medication Sig Start Date End Date Taking? Authorizing Provider  atenolol (TENORMIN) 50 MG tablet TAKE 1 TABLET BY MOUTH EACH DAY 09/19/15  Yes [provider]  Baclofen 5 MG TABS Take 5 mg by mouth 3 (three) times daily as needed for muscle spasms. 02/18/18  Yes Houston Siren, MD  docusate sodium (COLACE) 100 MG capsule Take 1 capsule (100 mg total) by mouth 2 (two) times daily as needed for mild constipation. 02/18/18  Yes Sainani, Rolly Pancake, MD  enoxaparin (LOVENOX) 40 MG/0.4ML injection Inject 0.4 mLs (40 mg total) into the skin daily for 14 days. 02/19/18 03/05/18 Yes Sainani, Rolly Pancake, MD    HYDROcodone-acetaminophen (NORCO/VICODIN) 5-325 MG tablet Take 1-2 tablets by mouth every 4 (four) hours as needed for moderate pain (pain score 4-6). 02/18/18  Yes Sainani, Rolly Pancake, MD  lisinopril (PRINIVIL,ZESTRIL) 40 MG tablet Take 40 mg by mouth daily. for blood pressure 01/29/18  Yes [provider]  PARoxetine (PAXIL) 20 MG tablet Take 20 mg by mouth daily. 01/29/18  Yes [provider]    Allergies Augmentin [amoxicillin-pot clavulanate]; Clindamycin/lincomycin; Demerol [meperidine]; and Percocet [oxycodone-acetaminophen]  Family History  Problem Relation Age of Onset  . Colon cancer Father   . Coronary artery disease Brother   . Leukemia Brother   . Stomach cancer Maternal Grandmother   . Breast cancer Maternal Grandmother     Social History Social History   Tobacco Use  . Smoking status: Never Smoker  . Smokeless tobacco: Never Used  Substance Use Topics  . Alcohol use: No    Alcohol/week: 0.0 oz  . Drug use: No    Review of Systems Constitutional: No fever/chills Eyes: No visual changes. ENT: No sore throat. Cardiovascular: Denies chest pain. Respiratory: Denies shortness of breath. Gastrointestinal: No abdominal pain.  No nausea, no vomiting.  No diarrhea.  No constipation. Genitourinary: Negative for dysuria. Musculoskeletal:  Positive for left hip and low back pain Integumentary: Negative for rash. Neurological: Negative for headaches, focal weakness or numbness.  ____________________________________________   PHYSICAL EXAM:  VITAL SIGNS: ED Triage Vitals  Enc Vitals Group  BP 02/19/18 0330 123/79     Pulse Rate 02/19/18 0340 93     Resp 02/19/18 0340 (!) 26     Temp 02/19/18 0347 98.9 F (37.2 C)     Temp Source 02/19/18 0347 Oral     SpO2 02/19/18 0340 (!) 89 %     Weight 02/19/18 0347 80.7 kg (178 lb)     Height 02/19/18 0347 1.626 m (5\' 4" )     Head Circumference --      Peak Flow --      Pain Score 02/19/18 0347 10      Pain Loc --      Pain Edu? --      Excl. in GC? --     Constitutional: Alert and oriented.  Apparent discomfort eyes: Conjunctivae are normal.  Head: Atraumatic. Mouth/Throat: Mucous membranes are moist. Oropharynx non-erythematous. Neck: No stridor.  No cervical spine tenderness to palpation. Cardiovascular: Normal rate, regular rhythm. Good peripheral circulation. Grossly normal heart sounds. Respiratory: Normal respiratory effort.  No retractions. Lungs CTAB. Gastrointestinal: Soft and nontender. No distention.  Musculoskeletal: Left hip pain gentle palpation.  Bleeding saturating the overlying dressing however no active bleeding from the surgical wound site.  No gross deformities of extremities. Neurologic:  Normal speech and language. No gross focal neurologic deficits are appreciated.  Skin:  Skin is warm, dry and intact. No rash noted. Psychiatric: Mood and affect are normal. Speech and behavior are normal.  ____________________________________________   LABS (all labs ordered are listed, but only abnormal results are displayed)  Labs Reviewed - No data to display ____________________________________________  EKG  ___________  RADIOLOGY I,  Dewayne Shorter, personally viewed and evaluated these images (plain radiographs) as part of my medical decision making, as well as reviewing the written report by the radiologist.  ED MD interpretation:   Official radiology report(s): Dg Lumbar Spine Complete  Result Date: 02/19/2018 CLINICAL DATA:  Status post fall, with lower back pain. Initial encounter. EXAM: LUMBAR SPINE - COMPLETE 4+ VIEW COMPARISON:  MRI of the lumbar spine performed 01/08/2008 FINDINGS: There is no evidence of acute fracture or subluxation. Vertebral bodies demonstrate normal height. There is mild grade 1 anterolisthesis of L4 on L5, reflecting underlying facet disease. Intervertebral disc spaces are preserved. The visualized bowel gas pattern is unremarkable  in appearance; air and stool are noted within the colon. The sacroiliac joints are within normal limits. IMPRESSION: No evidence of acute fracture or subluxation along the lumbar spine. Mild degenerative change at the lower lumbar spine. Electronically Signed   By: Roanna Raider M.D.   On: 02/19/2018 04:19   Dg Ankle 2 Views Left  Result Date: 02/18/2018 CLINICAL DATA:  Fall.  Left ankle pain. EXAM: LEFT ANKLE - 2 VIEW COMPARISON:  No recent prior. FINDINGS: Diffuse soft tissue swelling. Diffuse mild degenerative change. Pain. No acute bony or joint abnormality. Peripheral vascular calcification. IMPRESSION: 1.  No acute bony or joint abnormality. 2. Diffuse mild soft tissue swelling. Mild degenerative change. Calcaneal spurring. 3.  Peripheral vascular disease. Electronically Signed   By: Maisie Fus  Register   On: 02/18/2018 12:22   Dg Hip Unilat W Or Wo Pelvis 2-3 Views Left  Result Date: 02/19/2018 CLINICAL DATA:  Status post fall, with left hip pain. EXAM: DG HIP (WITH OR WITHOUT PELVIS) 2-3V LEFT COMPARISON:  Left hip intraoperative radiographs performed 02/16/2018 FINDINGS: There is no evidence of new fracture or dislocation. There appears to be increasing medial displacement of the lesser trochanteric fragment.  The patient's left femoral hardware appears grossly intact. The left femoral head remains seated at the acetabulum. The right hip joint is unremarkable. The sacroiliac joints are within normal limits. The visualized bowel gas pattern is unremarkable. IMPRESSION: 1. No evidence of new fracture or dislocation. Left femoral hardware appears intact. 2. Increasing medial displacement of the left lesser trochanteric fragment. Electronically Signed   By: Roanna RaiderJeffery  Chang M.D.   On: 02/19/2018 04:18      Procedures   ____________________________________________   INITIAL IMPRESSION / ASSESSMENT AND PLAN / ED COURSE  As part of my medical decision making, I reviewed the following data within  the electronic MEDICAL RECORD NUMBER   84 year old female presented with above-stated history physical exam status post accidental fall with resultant left hip pain following recent hip surgery.  X-ray was performed which revealed no fracture or dislocation of the left hip.  I discussed the x-ray with Dr. Martha ClanKrasinski.  Patient is requesting to be placed at a different facility other than [facility] and as such social work consult was placed.  Patient received IV morphine in the emergency department with pain improvement. ____________________________________________  FINAL CLINICAL IMPRESSION(S) / ED DIAGNOSES  Final diagnoses:  Pain of left hip joint     MEDICATIONS GIVEN DURING THIS VISIT:  Medications  morphine 2 MG/ML injection 2 mg (2 mg Intravenous Given 02/19/18 0340)  ondansetron (ZOFRAN) injection 4 mg (4 mg Intravenous Given 02/19/18 0340)  morphine 2 MG/ML injection 2 mg (2 mg Intravenous Given 02/19/18 0455)     ED Discharge Orders    None       Note:  This document was prepared using Dragon voice recognition software and may include unintentional dictation errors.    Darci CurrentBrown, [place] N, MD 02/19/18 [phone]

## 2018-02-19 NOTE — ED Notes (Addendum)
Family at bedside. Patient placed on 2L O2 d/t desat in the 80s when talking with family, patient is lying flat on her back

## 2018-02-19 NOTE — ED Notes (Signed)
Pt. Returned to tx. room in stable condition with no acute changes since departure from unit for scans.   

## 2018-02-19 NOTE — ED Notes (Signed)
Patient transported to X-ray 

## 2018-02-19 NOTE — ED Notes (Signed)
[facility].

## 2018-02-19 NOTE — ED Triage Notes (Addendum)
Pt bib [facility] from [facility] where she was d/c to yesterday (patient fell Saturday, broke left hip, surgery Sunday 3/10, d/c 3/12 to [facility]). Patient had 2 5mg  norco at 9pm, woke in the middle of the night and fell out of bed onto left hip that was just operated on Sunday (3/10). Patient has new bleeding under dressing, patient states pain is worse

## 2018-02-19 NOTE — ED Notes (Signed)
ED Provider at bedside. 

## 2018-02-19 NOTE — ED Notes (Signed)
Water given to pt per EDP approval. Patient and family updated on scan results by this RN per MD brown. Denies needs or concerns at this tim.e

## 2018-02-19 NOTE — ED Notes (Signed)
Pt given breakfast tray. Family at bedside  

## 2018-02-20 DIAGNOSIS — F39 Unspecified mood [affective] disorder: Secondary | ICD-10-CM | POA: Diagnosis not present

## 2018-02-20 DIAGNOSIS — S72012A Unspecified intracapsular fracture of left femur, initial encounter for closed fracture: Secondary | ICD-10-CM | POA: Diagnosis not present

## 2018-02-20 DIAGNOSIS — M79672 Pain in left foot: Secondary | ICD-10-CM | POA: Diagnosis not present

## 2018-02-20 DIAGNOSIS — K219 Gastro-esophageal reflux disease without esophagitis: Secondary | ICD-10-CM

## 2018-02-20 DIAGNOSIS — I10 Essential (primary) hypertension: Secondary | ICD-10-CM | POA: Diagnosis not present

## 2018-02-25 NOTE — Anesthesia Postprocedure Evaluation (Signed)
Anesthesia Post Note  Patient: Lisa Stokes  Procedure(s) Performed: INTRAMEDULLARY (IM) NAIL INTERTROCHANTRIC (Left Hip)  Patient location during evaluation: PACU Anesthesia Type: Spinal Level of consciousness: awake and alert Pain management: pain level controlled Vital Signs Assessment: post-procedure vital signs reviewed and stable Respiratory status: spontaneous breathing, nonlabored ventilation, respiratory function stable and patient connected to nasal cannula oxygen Cardiovascular status: blood pressure returned to baseline and stable Postop Assessment: no apparent nausea or vomiting Anesthetic complications: no     Last Vitals:  Vitals:   02/18/18 0930 02/18/18 1454  BP: 120/72 107/63  Pulse: 97 82  Resp:  18  Temp:  36.9 C  SpO2: 92% 92%    Last Pain:  Vitals:   02/18/18 1454  TempSrc: Oral  PainSc:                  Yevette EdwardsJames G Elhadji Pecore

## 2018-03-30 ENCOUNTER — Ambulatory Visit: Payer: Self-pay | Admitting: Orthopedic Surgery

## 2018-03-31 ENCOUNTER — Encounter
Admission: RE | Admit: 2018-03-31 | Discharge: 2018-03-31 | Disposition: A | Discharge status: Home / Self Care | Payer: Medicare Other | Source: Ambulatory Visit | Attending: Orthopedic Surgery | Admitting: Orthopedic Surgery

## 2018-03-31 ENCOUNTER — Other Ambulatory Visit: Payer: Self-pay

## 2018-03-31 HISTORY — DX: Depression, unspecified: F32.A

## 2018-03-31 HISTORY — DX: Other complications of anesthesia, initial encounter: T88.59XA

## 2018-03-31 HISTORY — DX: Major depressive disorder, single episode, unspecified: F32.9

## 2018-03-31 HISTORY — DX: Adverse effect of unspecified anesthetic, initial encounter: T41.45XA

## 2018-03-31 HISTORY — DX: Anxiety disorder, unspecified: F41.9

## 2018-03-31 HISTORY — DX: Gastro-esophageal reflux disease without esophagitis: K21.9

## 2018-03-31 HISTORY — DX: Dermatitis, unspecified: L30.9

## 2018-03-31 HISTORY — DX: Unspecified osteoarthritis, unspecified site: M19.90

## 2018-03-31 MED ORDER — CEFAZOLIN SODIUM-DEXTROSE 2-4 GM/100ML-% IV SOLN
2.0000 g | INTRAVENOUS | Status: AC
  Administered 2018-04-01: 2 g via INTRAVENOUS
  Filled 2018-03-31: qty 100

## 2018-03-31 MED ORDER — TRANEXAMIC ACID 1000 MG/10ML IV SOLN
1000.0000 mg | INTRAVENOUS | Status: DC
  Filled 2018-03-31: qty 10

## 2018-03-31 NOTE — Patient Instructions (Signed)
Your procedure is scheduled on: 04/01/18 0930 Report to Day Surgery. MEDICAL MALL SECOND FLOOR   Remember: Instructions that are not followed completely may result in serious medical risk, up to and including death, or upon the discretion of your surgeon and anesthesiologist your surgery may need to be rescheduled.     _X__ 1. Do not eat food after midnight the night before your procedure.                 No gum chewing or hard candies. You may drink clear liquids up to 2 hours                 before you are scheduled to arrive for your surgery- DO not drink clear                 liquids within 2 hours of the start of your surgery.                 Clear Liquids include:  water, apple juice without pulp, clear carbohydrate                 drink such as Clearfast of Gartorade, Black Coffee or Tea (Do not add                 anything to coffee or tea).  __X__2.  On the morning of surgery brush your teeth with toothpaste and water, you                 may rinse your mouth with mouthwash if you wish.  Do not swallow any              toothpaste of mouthwash.     _X__ 3.  No Alcohol for 24 hours before or after surgery.   _X__ 4.  Do Not Smoke or use e-cigarettes For 24 Hours Prior to Your Surgery.                 Do not use any chewable tobacco products for at least 6 hours prior to                 surgery.  ____  5.  Bring all medications with you on the day of surgery if instructed.   __X__  6.  Notify your doctor if there is any change in your medical condition      (cold, fever, infections).     Do not wear jewelry, make-up, hairpins, clips or nail polish. Do not wear lotions, powders, or perfumes. You may wear deodorant. Do not shave 48 hours prior to surgery. Men may shave face and neck. Do not bring valuables to the hospital.    [facility] is not responsible for any belongings or valuables.  Contacts, dentures or bridgework may not be worn into  surgery. Leave your suitcase in the car. After surgery it may be brought to your room. For patients admitted to the hospital, discharge time is determined by your treatment team.   Patients discharged the day of surgery will not be allowed to drive home.   _X Take these medicines the morning of surgery with A SIP OF WATER:    1. ATENOLOL  2. LISINOPRIL  3. PAROXETINE  4. VALIUM  5. PAIN MEDICATION  6.  ____ Fleet Enema (as directed)   ____ Use CHG Soap as directed  ____ Use inhalers on the day of surgery  ____ Stop metformin 2 days prior to surgery  ____ Take 1/2 of usual insulin dose the night before surgery. No insulin the morning          of surgery.   ____ Stop Coumadin/Plavix/aspirin on   ____ Stop Anti-inflammatories on  ____ Stop supplements until after surgery.    ____ Bring C-Pap to the hospital.                                                                                                       Your procedure is scheduled on: 04/01/18 0930 Report to Day Surgery. MEDICAL MALL SECOND FLOOR   Remember: Instructions that are not followed completely may result in serious medical risk, up to and including death, or upon the discretion of your surgeon and anesthesiologist your surgery may need to be rescheduled.     _X__ 1. Do not eat food after midnight the night before your procedure.                 No gum chewing or hard candies. You may drink clear liquids up to 2 hours                 before you are scheduled to arrive for your surgery- DO not drink clear                 liquids within 2 hours of the start of your surgery.                 Clear Liquids include:  water, apple juice without pulp, clear carbohydrate                 drink such as Clearfast of Gartorade, Black Coffee or Tea (Do not add                 anything to coffee or tea).  __X__2.  On the morning of surgery brush your teeth with  toothpaste and water, you                 may rinse your mouth with mouthwash if you wish.  Do not swallow any              toothpaste of mouthwash.     _X__ 3.  No Alcohol for 24 hours before or after surgery.   _X__ 4.  Do Not Smoke or use e-cigarettes For 24 Hours Prior to Your Surgery.                 Do not use any chewable tobacco products for at least 6 hours prior to                 surgery.  ____  5.  Bring all medications with you on the day of surgery if instructed.   __X__  6.  Notify your doctor if there is any change in your medical condition      (cold, fever, infections).     Do not wear jewelry, make-up, hairpins, clips or nail polish. Do not wear lotions, powders, or perfumes. You may wear deodorant. Do  not shave 48 hours prior to surgery. Men may shave face and neck. Do not bring valuables to the hospital.    [facility] is not responsible for any belongings or valuables.  Contacts, dentures or bridgework may not be worn into surgery. Leave your suitcase in the car. After surgery it may be brought to your room. For patients admitted to the hospital, discharge time is determined by your treatment team.   Patients discharged the day of surgery will not be allowed to drive HOME    _X___ Take these medicines the morning of surgery with A SIP OF WATER:    1.ATENOLOL  2. LISINOPRIL  3. PAROXETINE  4. VALIUM  5.PAIN MEDICATION  6.  ____ Fleet Enema (as directed)   ____ Use CHG Soap as directed  ____ Use inhalers on the day of surgery  ____ Stop metformin 2 days prior to surgery    ____ Take 1/2 of usual insulin dose the night before surgery. No insulin the morning          of surgery.   ____ Stop Coumadin/Plavix/aspirin on  ____ Stop Anti-inflammatories on   ____ Stop supplements until after surgery.    ____ Bring C-Pap to the hospital.

## 2018-04-01 ENCOUNTER — Inpatient Hospital Stay: Payer: Medicare Other | Admitting: Anesthesiology

## 2018-04-01 ENCOUNTER — Inpatient Hospital Stay
Admission: RE | Admit: 2018-04-01 | Discharge: 2018-04-04 | DRG: 470 | Disposition: A | Discharge status: Skilled Nursing Facility | Payer: Medicare Other | Source: Ambulatory Visit | Attending: Orthopedic Surgery | Admitting: Orthopedic Surgery

## 2018-04-01 ENCOUNTER — Other Ambulatory Visit: Payer: Self-pay

## 2018-04-01 ENCOUNTER — Encounter: Payer: Self-pay | Admitting: Anesthesiology

## 2018-04-01 ENCOUNTER — Encounter
Admission: RE | Disposition: A | Discharge status: Skilled Nursing Facility | Payer: Self-pay | Source: Ambulatory Visit | Attending: Orthopedic Surgery

## 2018-04-01 ENCOUNTER — Inpatient Hospital Stay: Payer: Medicare Other

## 2018-04-01 DIAGNOSIS — M545 Low back pain: Secondary | ICD-10-CM | POA: Diagnosis present

## 2018-04-01 DIAGNOSIS — F419 Anxiety disorder, unspecified: Secondary | ICD-10-CM | POA: Diagnosis present

## 2018-04-01 DIAGNOSIS — M1712 Unilateral primary osteoarthritis, left knee: Secondary | ICD-10-CM | POA: Diagnosis present

## 2018-04-01 DIAGNOSIS — Z79899 Other long term (current) drug therapy: Secondary | ICD-10-CM

## 2018-04-01 DIAGNOSIS — Y792 Prosthetic and other implants, materials and accessory orthopedic devices associated with adverse incidents: Secondary | ICD-10-CM | POA: Diagnosis not present

## 2018-04-01 DIAGNOSIS — Z9013 Acquired absence of bilateral breasts and nipples: Secondary | ICD-10-CM | POA: Diagnosis not present

## 2018-04-01 DIAGNOSIS — Y92009 Unspecified place in unspecified non-institutional (private) residence as the place of occurrence of the external cause: Secondary | ICD-10-CM

## 2018-04-01 DIAGNOSIS — T84115A Breakdown (mechanical) of internal fixation device of left femur, initial encounter: Secondary | ICD-10-CM | POA: Diagnosis present

## 2018-04-01 DIAGNOSIS — Z9849 Cataract extraction status, unspecified eye: Secondary | ICD-10-CM

## 2018-04-01 DIAGNOSIS — K219 Gastro-esophageal reflux disease without esophagitis: Secondary | ICD-10-CM | POA: Diagnosis present

## 2018-04-01 DIAGNOSIS — N183 Chronic kidney disease, stage 3 (moderate): Secondary | ICD-10-CM | POA: Diagnosis present

## 2018-04-01 DIAGNOSIS — I129 Hypertensive chronic kidney disease with stage 1 through stage 4 chronic kidney disease, or unspecified chronic kidney disease: Secondary | ICD-10-CM | POA: Diagnosis present

## 2018-04-01 DIAGNOSIS — F329 Major depressive disorder, single episode, unspecified: Secondary | ICD-10-CM | POA: Diagnosis present

## 2018-04-01 DIAGNOSIS — Z09 Encounter for follow-up examination after completed treatment for conditions other than malignant neoplasm: Secondary | ICD-10-CM

## 2018-04-01 DIAGNOSIS — T84125A Displacement of internal fixation device of left femur, initial encounter: Principal | ICD-10-CM | POA: Diagnosis present

## 2018-04-01 HISTORY — PX: TOTAL HIP ARTHROPLASTY: SHX124

## 2018-04-01 HISTORY — PX: HARDWARE REMOVAL: SHX979

## 2018-04-01 LAB — TYPE AND SCREEN
ABO/RH(D): A NEG
ANTIBODY SCREEN: POSITIVE
PT AG TYPE: POSITIVE

## 2018-04-01 LAB — BASIC METABOLIC PANEL
ANION GAP: 7 (ref 5–15)
BUN: 19 mg/dL (ref 6–20)
CALCIUM: 9.4 mg/dL (ref 8.9–10.3)
CO2: 27 mmol/L (ref 22–32)
CREATININE: 0.7 mg/dL (ref 0.44–1.00)
Chloride: 101 mmol/L (ref 101–111)
GFR calc non Af Amer: >60 mL/min (ref >60)
GLUCOSE: 111 mg/dL — AB (ref 65–99)
Potassium: 4.4 mmol/L (ref 3.5–5.1)
Sodium: 135 mmol/L (ref 135–145)

## 2018-04-01 LAB — CBC
HCT: 38.6 % (ref 35.0–47.0)
HEMOGLOBIN: 13.6 g/dL (ref 12.0–16.0)
MCH: 32.4 pg (ref 26.0–34.0)
MCHC: 35.4 g/dL (ref 32.0–36.0)
MCV: 91.5 fL (ref 80.0–100.0)
Platelets: 351 10*3/uL (ref 150–440)
RBC: 4.21 MIL/uL (ref 3.80–5.20)
RDW: 14.3 % (ref 11.5–14.5)
WBC: 10.6 10*3/uL (ref 3.6–11.0)

## 2018-04-01 LAB — APTT: aPTT: 29 s (ref 24–36)

## 2018-04-01 LAB — URINALYSIS, ROUTINE W REFLEX MICROSCOPIC
Bilirubin Urine: NEGATIVE
GLUCOSE, UA: NEGATIVE mg/dL
Hgb urine dipstick: NEGATIVE
Ketones, ur: NEGATIVE mg/dL
LEUKOCYTES UA: NEGATIVE
NITRITE: NEGATIVE
PH: 6 (ref 5.0–8.0)
Protein, ur: NEGATIVE mg/dL
SPECIFIC GRAVITY, URINE: 1.017 (ref 1.005–1.030)

## 2018-04-01 LAB — PROTIME-INR
INR: 1
PROTHROMBIN TIME: 13.1 s (ref 11.4–15.2)

## 2018-04-01 SURGERY — REMOVAL, HARDWARE
Anesthesia: Spinal | Site: Hip | Laterality: Left | Wound class: Clean

## 2018-04-01 MED ORDER — PROPOFOL 500 MG/50ML IV EMUL
INTRAVENOUS | Status: DC | PRN
  Administered 2018-04-01: 25 ug/kg/min via INTRAVENOUS

## 2018-04-01 MED ORDER — SUCCINYLCHOLINE CHLORIDE 20 MG/ML IJ SOLN
INTRAMUSCULAR | Status: AC
  Filled 2018-04-01: qty 1

## 2018-04-01 MED ORDER — GABAPENTIN 300 MG PO CAPS
300.0000 mg | ORAL_CAPSULE | Freq: Three times a day (TID) | ORAL | Status: DC
  Administered 2018-04-01 – 2018-04-04 (×8): 300 mg via ORAL
  Filled 2018-04-01 (×9): qty 1

## 2018-04-01 MED ORDER — LIDOCAINE HCL (CARDIAC) PF 100 MG/5ML IV SOSY
PREFILLED_SYRINGE | INTRAVENOUS | Status: DC | PRN
  Administered 2018-04-01: 60 mg via INTRAVENOUS

## 2018-04-01 MED ORDER — FENTANYL CITRATE (PF) 100 MCG/2ML IJ SOLN
25.0000 ug | INTRAMUSCULAR | Status: DC | PRN
  Administered 2018-04-01 (×2): 25 ug via INTRAVENOUS

## 2018-04-01 MED ORDER — PHENYLEPHRINE HCL 10 MG/ML IJ SOLN
INTRAMUSCULAR | Status: DC | PRN
  Administered 2018-04-01: 10 ug/min via INTRAVENOUS

## 2018-04-01 MED ORDER — KETOROLAC TROMETHAMINE 15 MG/ML IJ SOLN
7.5000 mg | Freq: Four times a day (QID) | INTRAMUSCULAR | Status: AC
  Administered 2018-04-01 – 2018-04-02 (×3): 7.5 mg via INTRAVENOUS
  Filled 2018-04-01 (×4): qty 1

## 2018-04-01 MED ORDER — GABAPENTIN 300 MG PO CAPS
ORAL_CAPSULE | ORAL | Status: AC
  Filled 2018-04-01: qty 1

## 2018-04-01 MED ORDER — FENTANYL CITRATE (PF) 100 MCG/2ML IJ SOLN
INTRAMUSCULAR | Status: AC
Start: 2018-04-01 — End: 2018-04-01
  Administered 2018-04-01: 25 ug via INTRAVENOUS
  Filled 2018-04-01: qty 2

## 2018-04-01 MED ORDER — CHLORHEXIDINE GLUCONATE 4 % EX LIQD: 60.0000 mL | Freq: Once | CUTANEOUS | Status: DC

## 2018-04-01 MED ORDER — TETRACAINE HCL 1 % IJ SOLN
INTRAMUSCULAR | Status: DC | PRN
  Administered 2018-04-01: 8 mg via INTRASPINAL

## 2018-04-01 MED ORDER — FAMOTIDINE 20 MG PO TABS
ORAL_TABLET | ORAL | Status: AC
  Filled 2018-04-01: qty 1

## 2018-04-01 MED ORDER — BACITRACIN 50000 UNITS IM SOLR
INTRAMUSCULAR | Status: AC
Start: 2018-04-01 — End: 2018-04-01
  Filled 2018-04-01: qty 2

## 2018-04-01 MED ORDER — MAGNESIUM CITRATE PO SOLN
1.0000 | Freq: Once | ORAL | Status: DC | PRN
  Filled 2018-04-01: qty 296

## 2018-04-01 MED ORDER — SODIUM CHLORIDE FLUSH 0.9 % IV SOLN
INTRAVENOUS | Status: AC
  Filled 2018-04-01: qty 20

## 2018-04-01 MED ORDER — PROPOFOL 10 MG/ML IV BOLUS
INTRAVENOUS | Status: AC
  Filled 2018-04-01: qty 20

## 2018-04-01 MED ORDER — LIDOCAINE HCL (PF) 2 % IJ SOLN
INTRAMUSCULAR | Status: AC
  Filled 2018-04-01: qty 10

## 2018-04-01 MED ORDER — ONDANSETRON HCL 4 MG/2ML IJ SOLN: 4.0000 mg | Freq: Once | INTRAMUSCULAR | Status: DC | PRN

## 2018-04-01 MED ORDER — DIAZEPAM 5 MG PO TABS
2.5000 mg | ORAL_TABLET | Freq: Four times a day (QID) | ORAL | Status: DC | PRN
  Administered 2018-04-01: 2.5 mg via ORAL
  Filled 2018-04-01: qty 1

## 2018-04-01 MED ORDER — TRANEXAMIC ACID 1000 MG/10ML IV SOLN
INTRAVENOUS | Status: DC | PRN
  Administered 2018-04-01 (×2): 1000 mg via INTRAVENOUS

## 2018-04-01 MED ORDER — ONDANSETRON HCL 4 MG/2ML IJ SOLN
INTRAMUSCULAR | Status: AC
Start: 2018-04-01 — End: 2018-04-01
  Filled 2018-04-01: qty 2

## 2018-04-01 MED ORDER — HYDROCODONE-ACETAMINOPHEN 7.5-325 MG PO TABS: 1.0000 | ORAL_TABLET | ORAL | Status: DC | PRN

## 2018-04-01 MED ORDER — FAMOTIDINE 20 MG PO TABS
20.0000 mg | ORAL_TABLET | Freq: Once | ORAL | Status: AC
  Administered 2018-04-01: 20 mg via ORAL

## 2018-04-01 MED ORDER — SODIUM CHLORIDE 0.9 % IV SOLN
INTRAVENOUS | Status: DC
  Administered 2018-04-01: 11:00:00 via INTRAVENOUS

## 2018-04-01 MED ORDER — METOCLOPRAMIDE HCL 5 MG/ML IJ SOLN: 5.0000 mg | Freq: Three times a day (TID) | INTRAMUSCULAR | Status: DC | PRN

## 2018-04-01 MED ORDER — FENTANYL CITRATE (PF) 100 MCG/2ML IJ SOLN
INTRAMUSCULAR | Status: AC
  Filled 2018-04-01: qty 2

## 2018-04-01 MED ORDER — GABAPENTIN 300 MG PO CAPS
300.0000 mg | ORAL_CAPSULE | Freq: Once | ORAL | Status: AC
  Administered 2018-04-01: 300 mg via ORAL

## 2018-04-01 MED ORDER — ONDANSETRON HCL 4 MG PO TABS: 4.0000 mg | ORAL_TABLET | Freq: Four times a day (QID) | ORAL | Status: DC | PRN

## 2018-04-01 MED ORDER — PHENOL 1.4 % MT LIQD
1.0000 | OROMUCOSAL | Status: DC | PRN
  Filled 2018-04-01: qty 177

## 2018-04-01 MED ORDER — ROCURONIUM BROMIDE 50 MG/5ML IV SOLN
INTRAVENOUS | Status: AC
  Filled 2018-04-01: qty 1

## 2018-04-01 MED ORDER — KETAMINE HCL 50 MG/ML IJ SOLN: INTRAMUSCULAR | Status: DC | PRN

## 2018-04-01 MED ORDER — BISACODYL 10 MG RE SUPP: 10.0000 mg | Freq: Every day | RECTAL | Status: DC | PRN

## 2018-04-01 MED ORDER — CEFAZOLIN SODIUM-DEXTROSE 1-4 GM/50ML-% IV SOLN
1.0000 g | Freq: Four times a day (QID) | INTRAVENOUS | Status: AC
  Administered 2018-04-01 (×2): 1 g via INTRAVENOUS
  Filled 2018-04-01 (×2): qty 50

## 2018-04-01 MED ORDER — MENTHOL 3 MG MT LOZG
1.0000 | LOZENGE | OROMUCOSAL | Status: DC | PRN
  Filled 2018-04-01: qty 9

## 2018-04-01 MED ORDER — METOCLOPRAMIDE HCL 10 MG PO TABS: 5.0000 mg | ORAL_TABLET | Freq: Three times a day (TID) | ORAL | Status: DC | PRN

## 2018-04-01 MED ORDER — CEFAZOLIN SODIUM-DEXTROSE 2-3 GM-%(50ML) IV SOLR
INTRAVENOUS | Status: AC
  Filled 2018-04-01: qty 50

## 2018-04-01 MED ORDER — TRANEXAMIC ACID 1000 MG/10ML IV SOLN
INTRAVENOUS | Status: AC
  Filled 2018-04-01: qty 10

## 2018-04-01 MED ORDER — SODIUM CHLORIDE 0.9 % IJ SOLN
INTRAMUSCULAR | Status: AC
  Filled 2018-04-01: qty 50

## 2018-04-01 MED ORDER — ACETAMINOPHEN 500 MG PO TABS
ORAL_TABLET | ORAL | Status: AC
Start: 2018-04-01 — End: 2018-04-01
  Filled 2018-04-01: qty 2

## 2018-04-01 MED ORDER — ACETAMINOPHEN 500 MG PO TABS
1000.0000 mg | ORAL_TABLET | Freq: Once | ORAL | Status: AC
  Administered 2018-04-01: 1000 mg via ORAL

## 2018-04-01 MED ORDER — KETAMINE HCL 10 MG/ML IJ SOLN
INTRAMUSCULAR | Status: DC | PRN
  Administered 2018-04-01: 10 mg via INTRAVENOUS
  Administered 2018-04-01: 5 mg via INTRAVENOUS

## 2018-04-01 MED ORDER — KETOROLAC TROMETHAMINE 15 MG/ML IJ SOLN
INTRAMUSCULAR | Status: AC
  Filled 2018-04-01: qty 1

## 2018-04-01 MED ORDER — LISINOPRIL 20 MG PO TABS
40.0000 mg | ORAL_TABLET | Freq: Every day | ORAL | Status: DC
  Administered 2018-04-02 – 2018-04-03 (×2): 40 mg via ORAL
  Filled 2018-04-01 (×3): qty 2

## 2018-04-01 MED ORDER — ONDANSETRON HCL 4 MG/2ML IJ SOLN: 4.0000 mg | Freq: Four times a day (QID) | INTRAMUSCULAR | Status: DC | PRN

## 2018-04-01 MED ORDER — ACETAMINOPHEN 500 MG PO TABS
500.0000 mg | ORAL_TABLET | Freq: Four times a day (QID) | ORAL | Status: AC
  Administered 2018-04-01 – 2018-04-02 (×2): 500 mg via ORAL
  Filled 2018-04-01 (×5): qty 1

## 2018-04-01 MED ORDER — HYDROCODONE-ACETAMINOPHEN 5-325 MG PO TABS
1.0000 | ORAL_TABLET | ORAL | Status: DC | PRN
  Administered 2018-04-01 (×2): 1 via ORAL
  Administered 2018-04-02 – 2018-04-04 (×10): 2 via ORAL
  Filled 2018-04-01 (×10): qty 2
  Filled 2018-04-01: qty 1
  Filled 2018-04-01: qty 2
  Filled 2018-04-01: qty 1

## 2018-04-01 MED ORDER — DOCUSATE SODIUM 100 MG PO CAPS
100.0000 mg | ORAL_CAPSULE | Freq: Two times a day (BID) | ORAL | Status: DC
  Administered 2018-04-01 – 2018-04-02 (×2): 100 mg via ORAL
  Filled 2018-04-01 (×5): qty 1

## 2018-04-01 MED ORDER — BACITRACIN 50000 UNITS IM SOLR
INTRAMUSCULAR | Status: AC
  Filled 2018-04-01: qty 3

## 2018-04-01 MED ORDER — SODIUM CHLORIDE 0.9 % IR SOLN
Status: DC | PRN
  Administered 2018-04-01: 11:00:00

## 2018-04-01 MED ORDER — PAROXETINE HCL 20 MG PO TABS
20.0000 mg | ORAL_TABLET | Freq: Every day | ORAL | Status: DC
  Administered 2018-04-02 – 2018-04-04 (×3): 20 mg via ORAL
  Filled 2018-04-01 (×3): qty 1

## 2018-04-01 MED ORDER — PROPOFOL 10 MG/ML IV BOLUS
INTRAVENOUS | Status: DC | PRN
  Administered 2018-04-01: 20 mg via INTRAVENOUS

## 2018-04-01 MED ORDER — BUPIVACAINE-EPINEPHRINE (PF) 0.25% -1:200000 IJ SOLN
INTRAMUSCULAR | Status: AC
  Filled 2018-04-01: qty 30

## 2018-04-01 MED ORDER — ATENOLOL 25 MG PO TABS
50.0000 mg | ORAL_TABLET | Freq: Every day | ORAL | Status: DC
  Administered 2018-04-02 – 2018-04-03 (×2): 50 mg via ORAL
  Filled 2018-04-01 (×3): qty 2

## 2018-04-01 MED ORDER — DEXAMETHASONE SODIUM PHOSPHATE 10 MG/ML IJ SOLN
INTRAMUSCULAR | Status: AC
  Filled 2018-04-01: qty 1

## 2018-04-01 MED ORDER — LACTATED RINGERS IV SOLN
INTRAVENOUS | Status: DC
  Administered 2018-04-01: 13:00:00 via INTRAVENOUS

## 2018-04-01 MED ORDER — GLYCOPYRROLATE 0.2 MG/ML IJ SOLN
INTRAMUSCULAR | Status: DC | PRN
  Administered 2018-04-01: 0.2 mg via INTRAVENOUS

## 2018-04-01 MED ORDER — LACTATED RINGERS IV SOLN
INTRAVENOUS | Status: DC
  Administered 2018-04-01: 16:00:00 via INTRAVENOUS

## 2018-04-01 MED ORDER — BUPIVACAINE-EPINEPHRINE (PF) 0.25% -1:200000 IJ SOLN
INTRAMUSCULAR | Status: DC | PRN
  Administered 2018-04-01: 20 mL via PERINEURAL

## 2018-04-01 MED ORDER — BUPIVACAINE IN DEXTROSE 0.75-8.25 % IT SOLN
INTRATHECAL | Status: DC | PRN
  Administered 2018-04-01: 1.7 mL via INTRATHECAL

## 2018-04-01 MED ORDER — KETAMINE HCL 50 MG/ML IJ SOLN
INTRAMUSCULAR | Status: AC
  Filled 2018-04-01: qty 10

## 2018-04-01 MED ORDER — ACETAMINOPHEN 325 MG PO TABS: 325.0000 mg | ORAL_TABLET | Freq: Four times a day (QID) | ORAL | Status: DC | PRN

## 2018-04-01 MED ORDER — ASPIRIN 81 MG PO CHEW
81.0000 mg | CHEWABLE_TABLET | Freq: Two times a day (BID) | ORAL | Status: DC
  Administered 2018-04-01 – 2018-04-04 (×6): 81 mg via ORAL
  Filled 2018-04-01 (×6): qty 1

## 2018-04-01 MED ORDER — MORPHINE SULFATE (PF) 2 MG/ML IV SOLN: 0.5000 mg | INTRAVENOUS | Status: DC | PRN

## 2018-04-01 MED ORDER — SODIUM CHLORIDE FLUSH 0.9 % IV SOLN
INTRAVENOUS | Status: AC
  Filled 2018-04-01: qty 30

## 2018-04-01 SURGICAL SUPPLY — 94 items
BAG DECANTER FOR FLEXI CONT (MISCELLANEOUS) IMPLANT
BANDAGE ACE 4X5 VEL STRL LF (GAUZE/BANDAGES/DRESSINGS) IMPLANT
BANDAGE ACE 6X5 VEL STRL LF (GAUZE/BANDAGES/DRESSINGS) ×3 IMPLANT
BLADE BOVIE TIP EXT 4 (BLADE) ×3 IMPLANT
BLADE SAGITTAL 25.0X1.19X90 (BLADE) ×2 IMPLANT
BLADE SAGITTAL 25.0X1.19X90MM (BLADE) ×1
BLADE SAW 1 (BLADE) ×3 IMPLANT
BNDG COHESIVE 4X5 TAN STRL (GAUZE/BANDAGES/DRESSINGS) IMPLANT
BNDG COHESIVE 6X5 TAN STRL LF (GAUZE/BANDAGES/DRESSINGS) ×3 IMPLANT
BRUSH SCRUB EZ  4% CHG (MISCELLANEOUS) ×2
BRUSH SCRUB EZ 4% CHG (MISCELLANEOUS) ×1 IMPLANT
CANISTER SUCT 1200ML W/VALVE (MISCELLANEOUS) ×3 IMPLANT
CANISTER SUCT 3000ML PPV (MISCELLANEOUS) ×6 IMPLANT
CHLORAPREP W/TINT 26ML (MISCELLANEOUS) ×6 IMPLANT
COVER HOLE (Hips) ×3 IMPLANT
COVER MAYO STAND STRL (DRAPES) ×6 IMPLANT
CRADLE LAMINECT ARM (MISCELLANEOUS) IMPLANT
CUFF TOURN 18 STER (MISCELLANEOUS) IMPLANT
CUFF TOURN 24 STER (MISCELLANEOUS) IMPLANT
CUP R3 50MM (Hips) ×3 IMPLANT
DRAPE FLUOR MINI C-ARM 54X84 (DRAPES) ×3 IMPLANT
DRAPE INCISE IOBAN 66X45 STRL (DRAPES) ×3 IMPLANT
DRAPE INCISE IOBAN 66X60 STRL (DRAPES) ×3 IMPLANT
DRAPE SHEET LG 3/4 BI-LAMINATE (DRAPES) ×3 IMPLANT
DRAPE SURG 17X11 SM STRL (DRAPES) ×3 IMPLANT
DRAPE TABLE BACK 80X90 (DRAPES) ×3 IMPLANT
DRAPE U-SHAPE 47X51 STRL (DRAPES) ×3 IMPLANT
DRSG AQUACEL AG ADV 3.5X10 (GAUZE/BANDAGES/DRESSINGS) IMPLANT
DRSG AQUACEL AG ADV 3.5X14 (GAUZE/BANDAGES/DRESSINGS) ×6 IMPLANT
ELECT BLADE 6.5 EXT (BLADE) ×3 IMPLANT
ELECT CAUTERY BLADE 6.4 (BLADE) ×3 IMPLANT
ELECT REM PT RETURN 9FT ADLT (ELECTROSURGICAL) ×3
ELECTRODE REM PT RTRN 9FT ADLT (ELECTROSURGICAL) ×1 IMPLANT
GAUZE PACK 2X3YD (MISCELLANEOUS) IMPLANT
GAUZE PETRO XEROFOAM 1X8 (MISCELLANEOUS) ×6 IMPLANT
GAUZE SPONGE 4X4 12PLY STRL (GAUZE/BANDAGES/DRESSINGS) ×3 IMPLANT
GAUZE XEROFORM 4X4 STRL (GAUZE/BANDAGES/DRESSINGS) ×3 IMPLANT
GLOVE BIOGEL PI IND STRL 7.0 (GLOVE) ×6 IMPLANT
GLOVE BIOGEL PI INDICATOR 7.0 (GLOVE) ×12
GLOVE INDICATOR 8.0 STRL GRN (GLOVE) ×3 IMPLANT
GLOVE SURG ORTHO 8.0 STRL STRW (GLOVE) ×6 IMPLANT
GOWN STRL REUS W/ TWL LRG LVL3 (GOWN DISPOSABLE) ×2 IMPLANT
GOWN STRL REUS W/ TWL XL LVL3 (GOWN DISPOSABLE) ×1 IMPLANT
GOWN STRL REUS W/TWL LRG LVL3 (GOWN DISPOSABLE) ×4
GOWN STRL REUS W/TWL XL LVL3 (GOWN DISPOSABLE) ×2
HEAD FEMORAL SZ21 -3 OXINIUM (Head) ×3 IMPLANT
HEMOVAC 400CC 10FR (MISCELLANEOUS) IMPLANT
HOLDER FOLEY CATH W/STRAP (MISCELLANEOUS) ×3 IMPLANT
HOOD PEEL AWAY FLYTE STAYCOOL (MISCELLANEOUS) ×6 IMPLANT
IV NS 100ML SINGLE PACK (IV SOLUTION) IMPLANT
KIT TURNOVER KIT A (KITS) ×3 IMPLANT
LINER 0 DEG 32X50MM (Hips) ×3 IMPLANT
NDL SAFETY ECLIPSE 18X1.5 (NEEDLE) ×1 IMPLANT
NEEDLE FILTER BLUNT 18X 1/2SAF (NEEDLE) ×8
NEEDLE FILTER BLUNT 18X1 1/2 (NEEDLE) ×4 IMPLANT
NEEDLE HYPO 18GX1.5 SHARP (NEEDLE) ×2
NEEDLE HYPO 22GX1.5 SAFETY (NEEDLE) ×3 IMPLANT
NEEDLE SPNL 20GX3.5 QUINCKE YW (NEEDLE) ×3 IMPLANT
NS IRRIG 1000ML POUR BTL (IV SOLUTION) ×3 IMPLANT
PACK EXTREMITY ARMC (MISCELLANEOUS) ×3 IMPLANT
PACK HIP PROSTHESIS (MISCELLANEOUS) ×3 IMPLANT
PILLOW ABDUCTION FOAM SM (MISCELLANEOUS) ×3 IMPLANT
PILLOW ABDUCTION MEDIUM (MISCELLANEOUS) ×3 IMPLANT
PRESSURIZER CEMENT PROX FEM SM (MISCELLANEOUS) IMPLANT
PRESSURIZER FEM CANAL M (MISCELLANEOUS) ×3 IMPLANT
PULSAVAC PLUS IRRIG FAN TIP (DISPOSABLE) ×3
RETRIEVER SUT HEWSON (MISCELLANEOUS) ×3 IMPLANT
SCREW 6.5X25MM (Screw) ×3 IMPLANT
SOL .9 NS 3000ML IRR  AL (IV SOLUTION) ×2
SOL .9 NS 3000ML IRR UROMATIC (IV SOLUTION) ×1 IMPLANT
STAPLER SKIN PROX 35W (STAPLE) ×3 IMPLANT
STEM FEM STD OFFSET SZ19 190 (Stem) ×3 IMPLANT
STOCKINETTE M/LG 89821 (MISCELLANEOUS) IMPLANT
SUT DVC 2 QUILL PDO  T11 36X36 (SUTURE) ×2
SUT DVC 2 QUILL PDO T11 36X36 (SUTURE) ×1 IMPLANT
SUT ETHIBOND #5 BRAIDED 30INL (SUTURE) ×3 IMPLANT
SUT MERSILENE 5MM BP 1 12 (SUTURE) ×6 IMPLANT
SUT PROLENE 4 0 PS 2 18 (SUTURE) ×3 IMPLANT
SUT QUILL PDO 2 24X24 VLT (SUTURE) ×3 IMPLANT
SUT VIC AB 1 CT1 18XCR BRD 8 (SUTURE) ×1 IMPLANT
SUT VIC AB 1 CT1 36 (SUTURE) ×3 IMPLANT
SUT VIC AB 1 CT1 8-18 (SUTURE) ×2
SUT VIC AB 2-0 CT1 18 (SUTURE) ×9 IMPLANT
SUT VIC AB 2-0 CT1 27 (SUTURE) ×2
SUT VIC AB 2-0 CT1 TAPERPNT 27 (SUTURE) ×1 IMPLANT
SUT VIC AB 2-0 SH 27 (SUTURE) ×4
SUT VIC AB 2-0 SH 27XBRD (SUTURE) ×2 IMPLANT
SYR 10ML LL (SYRINGE) ×3 IMPLANT
SYR 20CC LL (SYRINGE) ×3 IMPLANT
SYR TB 1ML 27GX1/2 LL (SYRINGE) ×3 IMPLANT
TIP BRUSH PULSAVAC PLUS 24.33 (MISCELLANEOUS) ×3 IMPLANT
TIP FAN IRRIG PULSAVAC PLUS (DISPOSABLE) ×1 IMPLANT
TOWER CARTRIDGE SMART MIX (DISPOSABLE) ×3 IMPLANT
TRAY FOLEY CATH SILVER 16FR LF (SET/KITS/TRAYS/PACK) ×3 IMPLANT

## 2018-04-01 NOTE — Anesthesia Preprocedure Evaluation (Addendum)
Anesthesia Evaluation  Patient identified by MRN, date of birth, ID band Patient awake    Reviewed: Allergy & Precautions, NPO status , Patient's Chart, lab work & pertinent test results, reviewed documented beta blocker date and time   Airway Mallampati: II  TM Distance: >3 FB     Dental  (+) Chipped, Missing   Pulmonary           Cardiovascular hypertension, Pt. on medications and Pt. on home beta blockers + Valvular Problems/Murmurs      Neuro/Psych PSYCHIATRIC DISORDERS Anxiety Depression    GI/Hepatic GERD  Controlled,  Endo/Other    Renal/GU Renal disease     Musculoskeletal  (+) Arthritis ,   Abdominal   Peds  Hematology   Anesthesia Other Findings   Reproductive/Obstetrics                            Anesthesia Physical Anesthesia Plan  ASA: III  Anesthesia Plan: Spinal   Post-op Pain Management:    Induction:   PONV Risk Score and Plan:   Airway Management Planned:   Additional Equipment:   Intra-op Plan:   Post-operative Plan:   Informed Consent: I have reviewed the patients History and Physical, chart, labs and discussed the procedure including the risks, benefits and alternatives for the proposed anesthesia with the patient or authorized representative who has indicated his/her understanding and acceptance.     Plan Discussed with: CRNA  Anesthesia Plan Comments:         Anesthesia Quick Evaluation

## 2018-04-01 NOTE — Progress Notes (Signed)
ADMISSION NOTE:  Pt admitted to room 149 from PACU. Pt alert and oriented X4. Surgical dressing clean dry and intact. Family at bedside. Bed in lowest position call bell in reach and bed alarm on.

## 2018-04-01 NOTE — Anesthesia Post-op Follow-up Note (Signed)
Anesthesia QCDR form completed.        

## 2018-04-01 NOTE — Transfer of Care (Signed)
Immediate Anesthesia Transfer of Care Note  Patient: Lisa Stokes  Procedure(s) Performed: HARDWARE REMOVAL-LEFT HIP SYNTHES NAIL (Left Hip) TOTAL HIP ARTHROPLASTY- POSTERIOR (Left Hip)  Patient Location: PACU  Anesthesia Type:Spinal  Level of Consciousness: drowsy  Airway & Oxygen Therapy: Patient Spontanous Breathing  Post-op Assessment: Report given to RN  Post vital signs: stable  Last Vitals:  Vitals Value Taken Time  BP    Temp    Pulse    Resp    SpO2      Last Pain:  Vitals:   04/01/18 0936  TempSrc: Tympanic  PainSc: 8          Complications: No apparent anesthesia complications

## 2018-04-01 NOTE — Anesthesia Procedure Notes (Signed)
Spinal  Patient location during procedure: OR Staffing Anesthesiologist: Meygan Kyser, MD Performed: anesthesiologist  Preanesthetic Checklist Completed: patient identified, site marked, surgical consent, pre-op evaluation, timeout performed, IV checked and risks and benefits discussed Spinal Block Patient position: sitting Prep: ChloraPrep Patient monitoring: heart rate, cardiac monitor, continuous pulse ox and blood pressure Approach: midline Location: L3-4 Injection technique: single-shot Needle Needle type: Pencil-Tip  Needle gauge: 25 G Needle length: 9 cm Assessment Sensory level: T10     

## 2018-04-01 NOTE — NC FL2 (Signed)
[place] MEDICAID FL2 LEVEL OF CARE SCREENING TOOL     IDENTIFICATION  Patient Name: Lisa Stokes Birthdate: 1934-01-24 Sex: female Admission Date (Current Location): 04/01/2018  [place] and [place] Number:  Chiropodist and Address:  [facility], [address] [place], [place] [address]      Provider Number: [id number]  Attending Physician Name and Address:  Lyndle Herrlich, MD  Relative Name and Phone Number:       Current Level of Care: Hospital Recommended Level of Care: Skilled Nursing Facility Prior Approval Number:    Date Approved/Denied:   PASRR Number: ([id number] )  Discharge Plan: SNF    Current Diagnoses: Patient Active Problem List   Diagnosis Date Noted  . Breakdown (mechanical) of internal fixation device of left femur, initial encounter (HCC) 04/01/2018  . Hip fracture (HCC) 02/15/2018    Orientation RESPIRATION BLADDER Height & Weight     Self, Time, Situation, Place  O2(2 Liters Oxygen ) Continent Weight: 172 lb (78 kg) Height:  5' 2.5" (158.8 cm)  BEHAVIORAL SYMPTOMS/MOOD NEUROLOGICAL BOWEL NUTRITION STATUS      Continent Diet(Diet: Full Liquid to be Advanced. )  AMBULATORY STATUS COMMUNICATION OF NEEDS Skin   Extensive Assist Verbally Surgical wounds(Incision: Left Hip. )                       Personal Care Assistance Level of Assistance  Bathing, Feeding, Dressing Bathing Assistance: Limited assistance Feeding assistance: Independent Dressing Assistance: Limited assistance     Functional Limitations Info  Sight, Hearing, Speech Sight Info: Adequate Hearing Info: Adequate Speech Info: Adequate    SPECIAL CARE FACTORS FREQUENCY  PT (By licensed PT), OT (By licensed OT)     PT Frequency: (5) OT Frequency: (5)            Contractures      Additional Factors Info  Code Status, Allergies Code Status Info: (Full Code. ) Allergies Info: (Augmentin Amoxicillin-pot Clavulanate,  Clindamycin/lincomycin, Demerol Meperidine, Percocet Oxycodone-acetaminophen)           Current Medications (04/01/2018):  This is the current hospital active medication list Current Facility-Administered Medications  Medication Dose Route Frequency Provider Last Rate Last Dose  . acetaminophen (TYLENOL) 500 MG tablet           . [START ON 04/02/2018] acetaminophen (TYLENOL) tablet 325-650 mg  325-650 mg Oral Q6H PRN Lyndle Herrlich, MD      . acetaminophen (TYLENOL) tablet 500 mg  500 mg Oral Q6H Lyndle Herrlich, MD      . aspirin chewable tablet 81 mg  81 mg Oral BID Lyndle Herrlich, MD      . Melene Muller ON 04/02/2018] atenolol (TENORMIN) tablet 50 mg  50 mg Oral Daily Lyndle Herrlich, MD      . bisacodyl (DULCOLAX) suppository 10 mg  10 mg Rectal Daily PRN Lyndle Herrlich, MD      . ceFAZolin (ANCEF) 2-3 GM-%(50ML) IVPB SOLR           . ceFAZolin (ANCEF) IVPB 1 g/50 mL premix  1 g Intravenous Q6H Lyndle Herrlich, MD 100 mL/hr at 04/01/18 1735 1 g at 04/01/18 1735  . diazepam (VALIUM) tablet 2.5 mg  2.5 mg Oral Q6H PRN Lyndle Herrlich, MD   2.5 mg at 04/01/18 1652  . docusate sodium (COLACE) capsule 100 mg  100 mg Oral BID Lyndle Herrlich, MD      .  famotidine (PEPCID) 20 MG tablet           . gabapentin (NEURONTIN) 300 MG capsule           . gabapentin (NEURONTIN) capsule 300 mg  300 mg Oral TID Lyndle HerrlichBowers, James R, MD      . HYDROcodone-acetaminophen [facility](NORCO) 7.5-325 MG per tablet 1-2 tablet  1-2 tablet Oral Q4H PRN Lyndle HerrlichBowers, James R, MD      . HYDROcodone-acetaminophen (NORCO/VICODIN) 5-325 MG per tablet 1-2 tablet  1-2 tablet Oral Q4H PRN Lyndle HerrlichBowers, James R, MD   1 tablet at 04/01/18 1601  . ketorolac (TORADOL) 15 MG/ML injection 7.5 mg  7.5 mg Intravenous Q6H Lyndle HerrlichBowers, James R, MD   7.5 mg at 04/01/18 1400  . ketorolac (TORADOL) 15 MG/ML injection           . lactated ringers infusion   Intravenous Continuous Lyndle HerrlichBowers, James R, MD 75 mL/hr at 04/01/18 1545    . [START ON 04/02/2018] lisinopril  (PRINIVIL,ZESTRIL) tablet 40 mg  40 mg Oral Daily Lyndle HerrlichBowers, James R, MD      . magnesium citrate solution 1 Bottle  1 Bottle Oral Once PRN Lyndle HerrlichBowers, James R, MD      . menthol-cetylpyridinium (CEPACOL) lozenge 3 mg  1 lozenge Oral PRN Lyndle HerrlichBowers, James R, MD       Or  . phenol (CHLORASEPTIC) mouth spray 1 spray  1 spray Mouth/Throat PRN Lyndle HerrlichBowers, James R, MD      . metoCLOPramide (REGLAN) tablet 5-10 mg  5-10 mg Oral Q8H PRN Lyndle HerrlichBowers, James R, MD       Or  . metoCLOPramide (REGLAN) injection 5-10 mg  5-10 mg Intravenous Q8H PRN Lyndle HerrlichBowers, James R, MD      . morphine 2 MG/ML injection 0.5-1 mg  0.5-1 mg Intravenous Q2H PRN Lyndle HerrlichBowers, James R, MD      . ondansetron [facility](ZOFRAN) tablet 4 mg  4 mg Oral Q6H PRN Lyndle HerrlichBowers, James R, MD       Or  . ondansetron [facility](ZOFRAN) injection 4 mg  4 mg Intravenous Q6H PRN Lyndle HerrlichBowers, James R, MD      . PARoxetine (PAXIL) tablet 20 mg  20 mg Oral Daily Lyndle HerrlichBowers, James R, MD         Discharge Medications: Please see discharge summary for a list of discharge medications.  Relevant Imaging Results:  Relevant Lab Results:   Additional Information (SSN: [id number])  Timica Marcom, Darleen CrockerBailey M, LCSW

## 2018-04-01 NOTE — H&P (Signed)
The patient has been re-examined, and the chart reviewed, and there have been no interval changes to the documented history and physical.  Plan a left hip removal of hardware and conversion to total hip arthroplasty today.  Anesthesia is not consulted regarding a peripheral nerve block for post-operative pain.  The risks, benefits, and alternatives have been discussed at length, and the patient is willing to proceed.

## 2018-04-01 NOTE — Op Note (Signed)
04/01/2018  1:43 PM  PATIENT:  Lisa Stokes   MRN: [id number]  PRE-OPERATIVE DIAGNOSIS:  M25.552 Pain in left hip T85.9XXA Unsp complication of internal prosth dev/grft, init  POST-OPERATIVE DIAGNOSIS:  M25.552 Pain in left hip T85.9XXA Unsp complication of internal prosth dev/grft, init  PROCEDURE:  Procedure(s): HARDWARE REMOVAL-LEFT HIP SYNTHES NAIL TOTAL HIP ARTHROPLASTY- POSTERIOR  PREOPERATIVE INDICATIONS:    Lisa Stokes is an 84 y.o. female who has a diagnosis of M25.552 Pain in left hip T85.9XXA Unsp complication of internal prosth dev/grft, init and elected for surgical management after failing conservative treatment.  The risks benefits and alternatives were discussed with the patient including but not limited to the risks of nonoperative treatment, versus surgical intervention including infection, bleeding, nerve injury, periprosthetic fracture, the need for revision surgery, dislocation, leg length discrepancy, blood clots, cardiopulmonary complications, morbidity, mortality, among others, and they were willing to proceed.     OPERATIVE REPORT     SURGEON: Elyn Aquas. Harlow Mares, MD    ASSISTANT:    Carlynn Spry  , [facility]  ANESTHESIA: Spinal    COMPLICATIONS:  None.   DRAINS: None  EBL:   200 mL                           COMPONENTS:  [employer] Redapt femur size 19 x 190 mm with a 32 mm/ -3 mm head ball and a  acetabular shell size 50 mm with a neutral polyethylene liner    PROCEDURE IN DETAIL:   The patient was met in the holding area and  identified.  The appropriate hip was identified and marked at the operative site.  The patient was then transported to the OR  and  placed under spinall anesthesia.  At that point, the patient was  placed in the lateral decubitus position with the operative side up and  secured to the operating room table and all bony prominences padded.     The operative lower extremity was prepped from the iliac crest to the  distal leg.  Sterile draping was performed.  Time out was performed prior to incision.      A routine posterolateral approach was utilized via sharp dissection  carried down to the subcutaneous tissue.  Gross bleeders were Bovie coagulated.  The iliotibial band was identified and incised along the length of the skin incision.  Self-retaining Charnley retractor was inserted.  With the hip internally rotated, the short external rotators  were identified, released, and tagged.  The hip capsule was released in a T-type fashion and the flaps tagged.    The hip was then dislocated posteriorly and the femoral neck was exposed. Extensive erosion of the femoral head and acetabulum was appreciated. The proximal aspect of the nail was identified and debrided of soft tissue. The set screw was loosened, and the helical blade removed. Next, the threaded rod was placed into the nail and the transverse locking bolt removed through a separate stab incision. The nail was then back-slapped out and passed from the field. I resected the femoral neck using the appropriate jig. There was no calcar present, and measurements were made using the greater trochanter. Extensive fibrous union and bony debris was removed with a rongeur.    I then exposed the deep acetabulum, cleared out any tissue including the ligamentum teres.  A wing retractor was placed.  After adequate visualization, I excised the labrum, and then sequentially reamed.  I placed  the trial acetabulum, which seated nicely, and then impacted the real cup into place.  Appropriate version and inclination was confirmed clinically matching their bony anatomy. A single screw was placed.  A polyethylene liner was placed and the wing retractor removed.    I then prepared the proximal femur using the starting reamer, the lateralizing reamer, and then sequentially reamed to size 19. The trials were placed and it was found to have excellent stability with functional range of  motion. The trial components were then removed, and the femoral prosthesis put in place with appropriate version, slightly anteverted to the normal anatomy, and I impacted the real head ball into place. The hip was then reduced and taken through functional range of motion and found to have excellent stability. Leg lengths were restored.  I closed the T in the capsule. And repaired the posterior capsule with #1 Vicryl  I then irrigated the hip copiously again with pulse lavage, and repaired the fascia with #2 Quill, followed by 2-0 Vicryl for the subcutaneous tissue, and staplesl for the skin, Steri-Strips and Aquacel The wounds were injected. Sponge and needle counts were correct.  The patient was then awakened and returned to PACU in stable and satisfactory condition. There were no complications.

## 2018-04-02 ENCOUNTER — Encounter: Payer: Self-pay | Admitting: Orthopedic Surgery

## 2018-04-02 LAB — BASIC METABOLIC PANEL
ANION GAP: 7 (ref 5–15)
BUN: 17 mg/dL (ref 6–20)
CHLORIDE: 99 mmol/L — AB (ref 101–111)
CO2: 26 mmol/L (ref 22–32)
Calcium: 8.5 mg/dL — ABNORMAL LOW (ref 8.9–10.3)
Creatinine, Ser: 0.83 mg/dL (ref 0.44–1.00)
GFR calc Af Amer: >60 mL/min (ref >60)
Glucose, Bld: 137 mg/dL — ABNORMAL HIGH (ref 65–99)
Potassium: 4.6 mmol/L (ref 3.5–5.1)
SODIUM: 132 mmol/L — AB (ref 135–145)

## 2018-04-02 LAB — CBC
HCT: 37.3 % (ref 35.0–47.0)
HEMOGLOBIN: 12.7 g/dL (ref 12.0–16.0)
MCH: 31.4 pg (ref 26.0–34.0)
MCHC: 34.1 g/dL (ref 32.0–36.0)
MCV: 92.1 fL (ref 80.0–100.0)
PLATELETS: 298 10*3/uL (ref 150–440)
RBC: 4.06 MIL/uL (ref 3.80–5.20)
RDW: 13.9 % (ref 11.5–14.5)
WBC: 9 10*3/uL (ref 3.6–11.0)

## 2018-04-02 NOTE — Anesthesia Postprocedure Evaluation (Signed)
Anesthesia Post Note  Patient: Lisa Stokes  Procedure(s) Performed: HARDWARE REMOVAL-LEFT HIP SYNTHES NAIL (Left Hip) TOTAL HIP ARTHROPLASTY- POSTERIOR (Left Hip)  Patient location during evaluation: Nursing Unit Anesthesia Type: Spinal Level of consciousness: awake, awake and alert and oriented Pain management: pain level controlled Vital Signs Assessment: post-procedure vital signs reviewed and stable Respiratory status: spontaneous breathing, nonlabored ventilation and respiratory function stable Cardiovascular status: blood pressure returned to baseline and stable Postop Assessment: no headache and no backache Anesthetic complications: no     Last Vitals:  Vitals:   04/01/18 2248 04/02/18 0733  BP: 116/66 133/74  Pulse: 75 85  Resp: 19 18  Temp: 37.1 C 37.3 C  SpO2: 99% 98%    Last Pain:  Vitals:   04/02/18 0733  TempSrc: Oral  PainSc:                  Lisa Stokes

## 2018-04-02 NOTE — Clinical Social Work Placement (Signed)
   CLINICAL SOCIAL WORK PLACEMENT  NOTE  Date:  04/02/2018  Patient Details  Name: Lisa Stokes MRN: [id number] Date of Birth: [birth year]  Clinical Social Work is seeking post-discharge placement for this patient at the Skilled  Nursing Facility level of care (*CSW will initial, date and re-position this form in  chart as items are completed):  Yes   Patient/family provided with [place] Clinical Social Work Department's list of facilities offering this level of care within the geographic area requested by the patient (or if unable, by the patient's family).  Yes   Patient/family informed of their freedom to choose among providers that offer the needed level of care, that participate in Medicare, Medicaid or managed care program needed by the patient, have an available bed and are willing to accept the patient.  Yes   Patient/family informed of [place]'s ownership interest in [facility] and [facility], as well as of the fact that they are under no obligation to receive care at these facilities.  PASRR submitted to EDS on       PASRR number received on       Existing PASRR number confirmed on 04/02/18     FL2 transmitted to all facilities in geographic area requested by pt/family on 04/02/18     FL2 transmitted to all facilities within larger geographic area on       Patient informed that his/her managed care company has contracts with or will negotiate with certain facilities, including the following:        Yes   Patient/family informed of bed offers received.  Patient chooses bed at [facility])     Physician recommends and patient chooses bed at [facility](SNF)    Patient to be transferred to [facility]) on  .  Patient to be transferred to facility by       Patient family notified on   of transfer.  Name of family member notified:        PHYSICIAN       Additional Comment:    _______________________________________________ Ruthe Mannanandace   Ausencio Vaden, LCSWA 04/02/2018, 10:14 AM

## 2018-04-02 NOTE — Evaluation (Signed)
Physical Therapy Evaluation Patient Details Name: Lisa Stokes MRN: [id number] DOB: [birth year] Today's Date: 04/02/2018   History of Present Illness  Pt is an 84 yo F diagnosed with complication of internal prosthetic device and is s/p removal of L hip nail with new L hip THA.  PMH includes: HTN, GERD, and renal disease.    Clinical Impression  Pt presents with deficits in strength, transfers, mobility, gait, balance, and activity tolerance.  Pt required extensive assistance with bed mobility training per below along with verbal cues for proper sequencing.  Pt required min A with transfers from an elevated EOB with verbal cues for sequencing.  Pt was able to ambulate 5' with a RW and CGA with cues for general sequencing with only mildly antalgic gait on the LLE and no c/o L hip pain once returned to sitting.  Pt found on 2LO2/min with SpO2 98-99% with nursing approving trial PT on room air.  Pt's SpO2 96% on room air after amb with HR 92 bpm, nursing notified and requested pt to stay on room air.  Pt will benefit from PT services in a SNF setting upon discharge to safely address above deficits for decreased caregiver assistance and eventual return to PLOF.      Follow Up Recommendations SNF;Supervision for mobility/OOB    Equipment Recommendations  None recommended by PT    Recommendations for Other Services       Precautions / Restrictions Precautions Precautions: Posterior Hip;Fall Precaution Booklet Issued: Yes (comment) Precaution Comments: Posterior hip precaution education provided to pt and daughters Restrictions Weight Bearing Restrictions: Yes LLE Weight Bearing: Weight bearing as tolerated      Mobility  Bed Mobility Overal bed mobility: Needs Assistance Bed Mobility: Supine to Sit     Supine to sit: Max assist     General bed mobility comments: Mod verbal cues for sequencing and assistance with LLE out of bed and to bring trunk to full upright  position  Transfers Overall transfer level: Needs assistance Equipment used: Rolling walker (2 wheeled) Transfers: Sit to/from Stand Sit to Stand: Min assist;From elevated surface         General transfer comment: Mod verbal and tactile cues for sequencing to maintain hip precautions  Ambulation/Gait Ambulation/Gait assistance: Min guard Ambulation Distance (Feet): 5 Feet Assistive device: Rolling walker (2 wheeled) Gait Pattern/deviations: Step-to pattern;Trunk flexed;Decreased step length - right;Decreased stance time - left;Antalgic Gait velocity: Decreased   General Gait Details: Mildly antalgic on the LLE but steady without LOB with mod verbal cues for general sequencing for step-to pattern  Stairs            Wheelchair Mobility    Modified Rankin (Stroke Patients Only)       Balance Overall balance assessment: Needs assistance Sitting-balance support: Feet unsupported;Bilateral upper extremity supported Sitting balance-Leahy Scale: Good     Standing balance support: Bilateral upper extremity supported Standing balance-Leahy Scale: Fair                               Pertinent Vitals/Pain Pain Assessment: No/denies pain    Home Living Family/patient expects to be discharged to:: Private residence Living Arrangements: Alone Available Help at Discharge: Family;Available PRN/intermittently Type of Home: House Home Access: Stairs to enter Entrance Stairs-Rails: Left Entrance Stairs-Number of Steps: 6 Home Layout: Two level;Able to live on main level with bedroom/bathroom Home Equipment: Dan Humphreys - 2 wheels      Prior Function  Level of Independence: Independent         Comments: Ind amb without AD, Ind with ADLs, one fall 7 weeks ago and then fell out of bed recently while at SNF     Hand Dominance   Dominant Hand: Right    Extremity/Trunk Assessment   Upper Extremity Assessment Upper Extremity Assessment: Overall WFL for tasks  assessed    Lower Extremity Assessment Lower Extremity Assessment: Generalized weakness;LLE deficits/detail LLE Deficits / Details: L hip flex < 3/5, B ankle DF and PF strong against manual resistance LLE: Unable to fully assess due to pain LLE Sensation: WNL       Communication   Communication: No difficulties  Cognition Arousal/Alertness: Awake/alert Behavior During Therapy: WFL for tasks assessed/performed Overall Cognitive Status: Within Functional Limits for tasks assessed                                        General Comments      Exercises Total Joint Exercises Ankle Circles/Pumps: Strengthening;Both;10 reps;15 reps Quad Sets: Strengthening;Both;5 reps;10 reps Gluteal Sets: Strengthening;Both;10 reps Hip ABduction/ADduction: AAROM;Left;5 reps Straight Leg Raises: AAROM;Left;5 reps Long Arc Quad: AROM;Both;10 reps Knee Flexion: AROM;Both;10 reps Marching in Standing: AROM;Both;5 reps Other Exercises Other Exercises: Posterior hip precaution training with pt and daughters with handout provided Other Exercises: Bed mobility and transfer training with verbal cues for sequencing Other Exercises: HEP education with handout provided   Assessment/Plan    PT Assessment Patient needs continued PT services  PT Problem List Decreased strength;Decreased activity tolerance;Decreased balance;Decreased mobility;Decreased knowledge of use of DME;Decreased knowledge of precautions       PT Treatment Interventions DME instruction;Gait training;Stair training;Functional mobility training;Balance training;Therapeutic exercise;Therapeutic activities;Patient/family education    PT Goals (Current goals can be found in the Care Plan section)  Acute Rehab PT Goals Patient Stated Goal: To be able to garden again PT Goal Formulation: With patient Time For Goal Achievement: 05/06/18 Potential to Achieve Goals: Good    Frequency BID   Barriers to discharge  Inaccessible home environment;Decreased caregiver support      Co-evaluation               AM-PAC PT "6 Clicks" Daily Activity  Outcome Measure Difficulty turning over in bed (including adjusting bedclothes, sheets and blankets)?: Unable Difficulty moving from lying on back to sitting on the side of the bed? : Unable Difficulty sitting down on and standing up from a chair with arms (e.g., wheelchair, bedside commode, etc,.)?: Unable Help needed moving to and from a bed to chair (including a wheelchair)?: A Little Help needed walking in hospital room?: A Lot Help needed climbing 3-5 steps with a railing? : Total 6 Click Score: 9    End of Session Equipment Utilized During Treatment: Gait belt Activity Tolerance: Patient tolerated treatment well Patient left: in chair;with call bell/phone within reach;with chair alarm set;with family/visitor present;with SCD's reapplied Nurse Communication: Mobility status;Other (comment)(Pt's SpO2 98-99% on 2LO2/min with nursing giving OK for trial on room air, results provided) PT Visit Diagnosis: Muscle weakness (generalized) (M62.81);Other abnormalities of gait and mobility (R26.89);History of falling (Z91.81)    Time: 6045-4098 PT Time Calculation (min) (ACUTE ONLY): 57 min   Charges:   PT Evaluation $PT Eval Low Complexity: 1 Low PT Treatments $Therapeutic Exercise: 8-22 mins $Therapeutic Activity: 8-22 mins   PT G Codes:        D. Lorin Picket  Arlee Bossard PT, DPT 04/02/18, 10:41 AM

## 2018-04-02 NOTE — Clinical Social Work Note (Signed)
Clinical Social Work Assessment  Patient Details  Name: Lisa Stokes MRN: [id number] Date of Birth: 04/12/1934  Date of referral:  04/02/18               Reason for consult:  Facility Placement, Discharge Planning                Permission sought to share information with:  Facility Sport and exercise psychologist, Family Supports Permission granted to share information::  Yes, Verbal Permission Granted  Name::      SNF  Agency::   [facility]  Relationship::     Contact Information:     Housing/Transportation Living arrangements for the past 2 months:  [place], [place] of Information:  Patient Patient Interpreter Needed:  None Criminal Activity/Legal Involvement Pertinent to Current Situation/Hospitalization:  No - Comment as needed Significant Relationships:  Adult Children(Lisa Stokes ) Lives with:  Self, Adult Children Do you feel safe going back to the place where you live?  Yes Need for family participation in patient care:  Yes (Comment)  Care giving concerns:  Patient lives alone in [place] but has been in SNF recently for rehab at [facility]. [name] [phone] has been staying with her in her home for the past 6 weeks.    Social Worker assessment / plan:  Holiday representative (CSW) received SNF consult. CSW met with patient and [name] to discuss discharge planning. Patient would like to go to [facility] at discharge. She states she has been there before and prefers to go back there for rehab. CSW contacted Seth Bake, Development worker, international aid at [facility] can take patient when ready for discharge. CSW explained to Seth Bake that patient is under Medicare Bundle plan and would not require a 3 night hospital stay. Seth Bake in agreement with patient coming to [facility] under Medicare Bundle plan. Dr. Harlow Mares in agreement with plan and will likely discharge patient on Friday. Medicare bundle  case manager Lisa Stokes is aware of above. FL2 complete. CSW will continue to follow and assist as needed.    Employment status:  Retired Forensic scientist:  Medicare(Medicare Tour manager ) PT Recommendations:  [place] / Referral to community resources:  [place]  Patient/Family's Response to care:  Patient is agreeable to going to SNF at [facility] for rehab.  Patient/Family's Understanding of and Emotional Response to Diagnosis, Current Treatment, and Prognosis:  Patient and [name] are agreeable and accepting of going to SNF at [facility] for rehab.   Emotional Assessment Appearance:  Appears stated age Attitude/Demeanor/Rapport:    Affect (typically observed):  Accepting, Pleasant Orientation:  Oriented to Self, Oriented to Place, Oriented to Situation, Oriented to  Time Alcohol / Substance use:  Not Applicable Psych involvement (Current and /or in the community):  No (Comment)  Discharge Needs  Concerns to be addressed:  Discharge Planning Concerns Readmission within the last 30 days:  Yes Current discharge risk:  None Barriers to Discharge:  Continued Medical Work up   [employer], [place] 04/02/2018, 10:01 AM

## 2018-04-02 NOTE — Progress Notes (Signed)
Subjective:  Patient reports pain as moderate.  Left knee pain is chronic.  Objective:   VITALS:   Vitals:   04/01/18 1805 04/01/18 1959 04/01/18 2248 04/02/18 0733  BP: 136/77 127/76 116/66 133/74  Pulse: 71 65 75 85  Resp: 20 20 19 18   Temp: 98.6 F (37 C) 98.4 F (36.9 C) 98.7 F (37.1 C) 99.2 F (37.3 C)  TempSrc: Oral Oral  Oral  SpO2: 100% 100% 99% 98%  Weight:      Height:        PHYSICAL EXAM:  Neurovascular intact Sensation intact distally Incision: scant drainage Compartment soft  LABS  Results for orders placed or performed during the hospital encounter of 04/01/18 (from the past 24 hour(s))  Urinalysis, Routine w reflex microscopic     Status: Abnormal   Collection Time: 04/01/18  9:24 AM  Result Value Ref Range   Color, Urine YELLOW (A) YELLOW   APPearance CLEAR (A) CLEAR   Specific Gravity, Urine 1.017 1.005 - 1.030   pH 6.0 5.0 - 8.0   Glucose, UA NEGATIVE NEGATIVE mg/dL   Hgb urine dipstick NEGATIVE NEGATIVE   Bilirubin Urine NEGATIVE NEGATIVE   Ketones, ur NEGATIVE NEGATIVE mg/dL   Protein, ur NEGATIVE NEGATIVE mg/dL   Nitrite NEGATIVE NEGATIVE   Leukocytes, UA NEGATIVE NEGATIVE  APTT     Status: None   Collection Time: 04/01/18  9:40 AM  Result Value Ref Range   aPTT 29 24 - 36 seconds  Basic metabolic panel     Status: Abnormal   Collection Time: 04/01/18  9:40 AM  Result Value Ref Range   Sodium 135 135 - 145 mmol/L   Potassium 4.4 3.5 - 5.1 mmol/L   Chloride 101 101 - 111 mmol/L   CO2 27 22 - 32 mmol/L   Glucose, Bld 111 (H) 65 - 99 mg/dL   BUN 19 6 - 20 mg/dL   Creatinine, Ser 1.61 0.44 - 1.00 mg/dL   Calcium 9.4 8.9 - 09.6 mg/dL   GFR calc non Af Amer >60 >60 mL/min   GFR calc Af Amer >60 >60 mL/min   Anion gap 7 5 - 15  CBC     Status: None   Collection Time: 04/01/18  9:40 AM  Result Value Ref Range   WBC 10.6 3.6 - 11.0 K/uL   RBC 4.21 3.80 - 5.20 MIL/uL   Hemoglobin 13.6 12.0 - 16.0 g/dL   HCT 04.5 40.9 - 81.1 %   MCV 91.5 80.0 - 100.0 fL   MCH 32.4 26.0 - 34.0 pg   MCHC 35.4 32.0 - 36.0 g/dL   RDW 91.4 78.2 - 95.6 %   Platelets 351 150 - 440 K/uL  Protime-INR     Status: None   Collection Time: 04/01/18  9:40 AM  Result Value Ref Range   Prothrombin Time 13.1 11.4 - 15.2 seconds   INR 1.00   Type and screen [facility]     Status: None   Collection Time: 04/01/18  9:40 AM  Result Value Ref Range   ABO/RH(D) A NEG    Antibody Screen POS    Sample Expiration 04/04/2018    Antibody Identification ANTI E    PT AG Type      POSITIVE FOR c ANTIGEN Performed at [facility], [address] [place], [place] [address]   CBC     Status: None   Collection Time: 04/02/18  3:23 AM  Result Value Ref Range  WBC 9.0 3.6 - 11.0 K/uL   RBC 4.06 3.80 - 5.20 MIL/uL   Hemoglobin 12.7 12.0 - 16.0 g/dL   HCT 16.137.3 09.635.0 - 04.547.0 %   MCV 92.1 80.0 - 100.0 fL   MCH 31.4 26.0 - 34.0 pg   MCHC 34.1 32.0 - 36.0 g/dL   RDW 40.913.9 81.111.5 - 91.414.5 %   Platelets 298 150 - 440 K/uL  Basic metabolic panel     Status: Abnormal   Collection Time: 04/02/18  3:23 AM  Result Value Ref Range   Sodium 132 (L) 135 - 145 mmol/L   Potassium 4.6 3.5 - 5.1 mmol/L   Chloride 99 (L) 101 - 111 mmol/L   CO2 26 22 - 32 mmol/L   Glucose, Bld 137 (H) 65 - 99 mg/dL   BUN 17 6 - 20 mg/dL   Creatinine, Ser 7.820.83 0.44 - 1.00 mg/dL   Calcium 8.5 (L) 8.9 - 10.3 mg/dL   GFR calc non Af Amer >60 >60 mL/min   GFR calc Af Amer >60 >60 mL/min   Anion gap 7 5 - 15    Dg Pelvis Portable  Result Date: 04/01/2018 CLINICAL DATA:  Status post left total hip joint prosthesis placement. EXAM: PORTABLE PELVIS 1-2 VIEWS COMPARISON:  Left hip series of February 19, 2018 FINDINGS: The patient has undergone removal of the telescoping screw and intramedullary rod which have been placed for a previous intertrochanteric fracture. The patient has undergone placement of a total joint prosthesis. The orientation of the acetabular cup is  nearly horizontal with the dome positioned superiorly. The femoral component of the prosthesis appears appropriately positioned with the interface with the native bone being unremarkable. IMPRESSION: Status post hardware removal from previous ORIF and placement of total hip joint prosthesis. Correlation clinically as to the adequacy of the positioning of the acetabular cup is needed. The femoral component of the prosthesis is unremarkable. Electronically Signed   By: David  Swazil[place] M.D.   On: 04/01/2018 14:18    Assessment/Plan: 1 Day Post-Op   Active Problems:   Breakdown (mechanical) of internal fixation device of left femur, initial encounter (HCC)   Advance diet Up with therapy D/C IV fluids Discharge to SNF likely on Friday May remove Foley catheter tomorrow morning 0600   Lyndle HerrlichJames R Bowers , MD 04/02/2018, 8:48 AM

## 2018-04-02 NOTE — Progress Notes (Signed)
Physical Therapy Treatment Patient Details Name: Lisa Stokes MRN: [id number] DOB: 1934/02/20 Today's Date: 04/02/2018    History of Present Illness Pt is an 84 yo F diagnosed with complication of internal prosthetic device and is s/p removal of L hip nail with new L hip THA.  PMH includes: HTN, GERD, and renal disease.    PT Comments    Pt presents with deficits in strength, transfers, mobility, gait, balance, and activity tolerance.  Pt required min A and verbal cues for sequencing during sit to/from stand transfer training from various height surfaces.  After therex, transfer training, and hip precaution education/review in pt's room the pt initiated amb in hallway from her recliner and had a bowel movement requiring assist for hygiene with extended static standing and then short amb to [facility].  Amb remains effortful and antalgic on the LLE but the pain is reported as knee pain with no L hip pain reported.  Pt will benefit from PT services in a SNF setting upon discharge to safely address above deficits for decreased caregiver assistance and eventual return to PLOF.       Follow Up Recommendations  SNF;Supervision for mobility/OOB     Equipment Recommendations  None recommended by PT    Recommendations for Other Services       Precautions / Restrictions Precautions Precautions: Posterior Hip;Fall Precaution Booklet Issued: Yes (comment) Precaution Comments: Posterior hip precaution education provided to pt and daughters Restrictions Weight Bearing Restrictions: Yes LLE Weight Bearing: Weight bearing as tolerated    Mobility  Bed Mobility               General bed mobility comments: NT this session with pt in recliner  Transfers Overall transfer level: Needs assistance Equipment used: Rolling walker (2 wheeled) Transfers: Sit to/from Stand Sit to Stand: Min assist;From elevated surface         General transfer comment: Mod verbal and tactile cues for  sequencing to maintain hip precautions during transfer training from various height surfaces  Ambulation/Gait Ambulation/Gait assistance: Min guard Ambulation Distance (Feet): 5 Feet Assistive device: Rolling walker (2 wheeled) Gait Pattern/deviations: Step-to pattern;Trunk flexed;Decreased step length - right;Decreased stance time - left;Antalgic Gait velocity: Decreased   General Gait Details: Pt antalgic on the L knee but steady without LOB with mod verbal cues for general sequencing for step-to pattern and turning while closer to the [employer]             Wheelchair Mobility    Modified Rankin (Stroke Patients Only)       Balance Overall balance assessment: Needs assistance Sitting-balance support: Feet unsupported;Bilateral upper extremity supported Sitting balance-Leahy Scale: Good     Standing balance support: Bilateral upper extremity supported Standing balance-Leahy Scale: Fair                              Cognition Arousal/Alertness: Awake/alert Behavior During Therapy: WFL for tasks assessed/performed Overall Cognitive Status: Within Functional Limits for tasks assessed                                        Exercises Total Joint Exercises Long Arc Quad: AROM;Both;10 reps;15 reps Knee Flexion: AROM;Both;10 reps;15 reps Marching in Standing: AROM;Both;5 reps;Standing Other Exercises Other Exercises: Static standing balance training and therex for improved standing tolerance    General Comments  Pertinent Vitals/Pain Pain Assessment: 0-10 Pain Score: 8  Pain Location: No pain at rest but 8/10 pain in the L knee during knee flex and during amb, no pain in the surgical hip Pain Descriptors / Indicators: Aching;Sore Pain Intervention(s): Monitored during session;Patient requesting pain meds-RN notified;Limited activity within patient's tolerance    Home Living                      Prior Function             PT Goals (current goals can now be found in the care plan section) Progress towards PT goals: Progressing toward goals    Frequency    BID      PT Plan Current plan remains appropriate    Co-evaluation              AM-PAC PT "6 Clicks" Daily Activity  Outcome Measure                   End of Session Equipment Utilized During Treatment: Gait belt Activity Tolerance: Patient tolerated treatment well Patient left: Other (comment);with family/visitor present;with nursing/sitter in room(On BSC with nursing and family present) Nurse Communication: Mobility status PT Visit Diagnosis: Muscle weakness (generalized) (M62.81);Other abnormalities of gait and mobility (R26.89);History of falling (Z91.81)     Time: 1435-1500 PT Time Calculation (min) (ACUTE ONLY): 25 min  Charges:  $Gait Training: 8-22 mins $Therapeutic Activity: 8-22 mins                    G Codes:       Elly Modena. Scott Scorpio Fortin PT, DPT 04/02/18, 3:22 PM

## 2018-04-03 ENCOUNTER — Encounter: Payer: Self-pay | Admitting: Orthopedic Surgery

## 2018-04-03 LAB — CBC
HEMATOCRIT: 31.5 % — AB (ref 35.0–47.0)
HEMOGLOBIN: 10.9 g/dL — AB (ref 12.0–16.0)
MCH: 31.9 pg (ref 26.0–34.0)
MCHC: 34.6 g/dL (ref 32.0–36.0)
MCV: 92.1 fL (ref 80.0–100.0)
Platelets: 253 10*3/uL (ref 150–440)
RBC: 3.42 MIL/uL — AB (ref 3.80–5.20)
RDW: 13.7 % (ref 11.5–14.5)
WBC: 9.5 10*3/uL (ref 3.6–11.0)

## 2018-04-03 LAB — SURGICAL PATHOLOGY

## 2018-04-03 MED ORDER — HYDROCODONE-ACETAMINOPHEN 5-325 MG PO TABS: 1.0000 | ORAL_TABLET | ORAL | 0 refills | Status: AC | PRN

## 2018-04-03 MED ORDER — ASPIRIN 81 MG PO CHEW: 81.0000 mg | CHEWABLE_TABLET | Freq: Two times a day (BID) | ORAL | 0 refills | Status: DC

## 2018-04-03 NOTE — Progress Notes (Signed)
  Subjective:  Patient reports pain as moderate.  Pain is primarily in the left knee.  Objective:   VITALS:   Vitals:   04/02/18 0947 04/02/18 1026 04/02/18 2355 04/03/18 0721  BP: 124/66  124/61 127/70  Pulse: 90 92 78 84  Resp:   19 18  Temp:   98.5 F (36.9 C) 98.5 F (36.9 C)  TempSrc:   Oral Oral  SpO2:  96% 95% 96%  Weight:      Height:        PHYSICAL EXAM:  Neurovascular intact Sensation intact distally Intact pulses distally Dorsiflexion/Plantar flexion intact Incision: scant drainage Compartment soft  LABS  Results for orders placed or performed during the hospital encounter of 04/01/18 (from the past 24 hour(s))  CBC     Status: Abnormal   Collection Time: 04/03/18  6:30 AM  Result Value Ref Range   WBC 9.5 3.6 - 11.0 K/uL   RBC 3.42 (L) 3.80 - 5.20 MIL/uL   Hemoglobin 10.9 (L) 12.0 - 16.0 g/dL   HCT 95.631.5 (L) 21.335.0 - 08.647.0 %   MCV 92.1 80.0 - 100.0 fL   MCH 31.9 26.0 - 34.0 pg   MCHC 34.6 32.0 - 36.0 g/dL   RDW 57.813.7 46.911.5 - 62.914.5 %   Platelets 253 150 - 440 K/uL    Dg Pelvis Portable  Result Date: 04/01/2018 CLINICAL DATA:  Status post left total hip joint prosthesis placement. EXAM: PORTABLE PELVIS 1-2 VIEWS COMPARISON:  Left hip series of February 19, 2018 FINDINGS: The patient has undergone removal of the telescoping screw and intramedullary rod which have been placed for a previous intertrochanteric fracture. The patient has undergone placement of a total joint prosthesis. The orientation of the acetabular cup is nearly horizontal with the dome positioned superiorly. The femoral component of the prosthesis appears appropriately positioned with the interface with the native bone being unremarkable. IMPRESSION: Status post hardware removal from previous ORIF and placement of total hip joint prosthesis. Correlation clinically as to the adequacy of the positioning of the acetabular cup is needed. The femoral component of the prosthesis is unremarkable.  Electronically Signed   By: David  [place]Jordan M.D.   On: 04/01/2018 14:18    Assessment/Plan: 2 Days Post-Op   Active Problems:   Breakdown (mechanical) of internal fixation device of left femur, initial encounter (HCC)   Up with therapy Discharge to SNF likely tomorrow   Lyndle HerrlichJames R Bowers , MD 04/03/2018, 7:56 AM

## 2018-04-03 NOTE — Progress Notes (Signed)
Physical Therapy Treatment Patient Details Name: Lisa Stokes MRN: [id number] DOB: 05-17-1934 Today's Date: 04/03/2018    History of Present Illness Pt is an 84 yo F diagnosed with complication of internal prosthetic device and is s/p removal of L hip nail with new L hip THA.  PMH includes: HTN, GERD, and renal disease.    PT Comments    Pt presents with deficits in strength, transfers, mobility, gait, balance, and activity tolerance but made good progress this session.  Pt continues to require extensive physical assistance with bed mobility tasks and cueing on proper sequencing with all functional mobility in order to remain compliant with posterior hip precautions but she increased her ambulation to 40' this session and is very motivated to participate in therapy.  During amb the pt c/o feeling as if the LLE was "longer" than the RLE and she did have trouble with clearance when advancing the LLE but had excess clearance when advancing the RLE.  Pt will benefit from PT services in a SNF setting upon discharge to safely address above deficits for decreased caregiver assistance and eventual return to PLOF.     Follow Up Recommendations  SNF;Supervision for mobility/OOB     Equipment Recommendations  None recommended by PT    Recommendations for Other Services       Precautions / Restrictions Precautions Precautions: Posterior Hip;Fall Precaution Booklet Issued: Yes (comment) Precaution Comments: Posterior hip precaution education and review provided to pt, grandaughter, and daughter Restrictions Weight Bearing Restrictions: Yes LLE Weight Bearing: Weight bearing as tolerated    Mobility  Bed Mobility Overal bed mobility: Needs Assistance Bed Mobility: Supine to Sit     Supine to sit: Max assist;Mod assist     General bed mobility comments: Log roll training to the R with pillow between knees to prevent L hip Adduction  Transfers Overall transfer level: Needs  assistance Equipment used: Rolling walker (2 wheeled) Transfers: Sit to/from Stand Sit to Stand: Min assist;From elevated surface         General transfer comment: Mod verbal and tactile cues for sequencing to maintain hip precautions during transfer training from various height surfaces  Ambulation/Gait Ambulation/Gait assistance: Min guard Ambulation Distance (Feet): 40 Feet Assistive device: Rolling walker (2 wheeled) Gait Pattern/deviations: Step-to pattern;Antalgic;Trunk flexed;Decreased step length - right;Decreased stance time - left Gait velocity: Decreased   General Gait Details: Step-to pattern with occasional beginning step-through with very slow cadence and poor clearance while advancing the LLE   Stairs             Wheelchair Mobility    Modified Rankin (Stroke Patients Only)       Balance Overall balance assessment: Needs assistance Sitting-balance support: Feet unsupported;Bilateral upper extremity supported Sitting balance-Leahy Scale: Good     Standing balance support: Bilateral upper extremity supported Standing balance-Leahy Scale: Fair                              Cognition Arousal/Alertness: Awake/alert Behavior During Therapy: WFL for tasks assessed/performed Overall Cognitive Status: Within Functional Limits for tasks assessed                                        Exercises Total Joint Exercises Ankle Circles/Pumps: Strengthening;Both;10 reps;15 reps Quad Sets: Strengthening;Both;5 reps;10 reps Heel Slides: AAROM;5 reps;Both;AROM Hip ABduction/ADduction: AAROM;10 reps;Strengthening;Both(AAROM on the LLE)  Straight Leg Raises: AAROM;Left;10 reps Long Arc Quad: AROM;Both;10 reps Knee Flexion: AROM;Both;10 reps;15 reps Other Exercises Other Exercises: HEP review with family and pt per handout Other Exercises: Hip precaution review with pt and family including during functional tasks Other Exercises: 90 deg  L turn training with pt and family to prevent CKD L hip IR    General Comments        Pertinent Vitals/Pain Pain Assessment: 0-10 Pain Location: No pain at rest but 8/10 pain in the L hip after amb Pain Descriptors / Indicators: Aching;Sore Pain Intervention(s): Premedicated before session;Monitored during session    Home Living                      Prior Function            PT Goals (current goals can now be found in the care plan section) Progress towards PT goals: Progressing toward goals    Frequency    BID      PT Plan Current plan remains appropriate    Co-evaluation              AM-PAC PT "6 Clicks" Daily Activity  Outcome Measure                   End of Session Equipment Utilized During Treatment: Gait belt Activity Tolerance: Patient tolerated treatment well Patient left: in chair;with SCD's reapplied;with call bell/phone within reach;with chair alarm set;with family/visitor present;Other (comment) Nurse Communication: Mobility status PT Visit Diagnosis: Muscle weakness (generalized) (M62.81);Other abnormalities of gait and mobility (R26.89);History of falling (Z91.81)     Time: 1610-9604 PT Time Calculation (min) (ACUTE ONLY): 43 min  Charges:  $Gait Training: 8-22 mins $Therapeutic Exercise: 8-22 mins $Therapeutic Activity: 8-22 mins                    G Codes:       DElly Modena PT, DPT 04/03/18, 12:26 PM

## 2018-04-03 NOTE — Progress Notes (Signed)
Physical Therapy Treatment Patient Details Name: Lisa Stokes MRN: [id number] DOB: [birth year] Today's Date: 04/03/2018    History of Present Illness Pt is an 84 yo F diagnosed with complication of internal prosthetic device and is s/p removal of L hip nail with new L hip THA.  PMH includes: HTN, GERD, and renal disease.    PT Comments    Pt presents with deficits in strength, transfers, mobility, gait, balance, and activity tolerance but continues to slowly progress towards goals.  Pt recalled 1/3 hip precautions this session with pt and new family members and her sitter present this session with extensive education provided regarding posterior hip precautions and how to maintain them during functional tasks.  Pt continues to require min A and heavy cues for proper sequencing with transfers to maintain hip precautions.  Pt ambulated 50' this session with a RW and close CGA with extensive verbal and tactile cues for proper sequencing most notably for upright posture and amb closer to RW as well as training on 90 deg L turns to avoid closed kinetic chain hip IR.     Follow Up Recommendations  SNF;Supervision for mobility/OOB     Equipment Recommendations  None recommended by PT    Recommendations for Other Services       Precautions / Restrictions Precautions Precautions: Posterior Hip;Fall Precaution Booklet Issued: Yes (comment) Precaution Comments: Posterior hip precaution education and review provided to pt, daughter, and sitter Restrictions Weight Bearing Restrictions: Yes LLE Weight Bearing: Weight bearing as tolerated    Mobility  Bed Mobility               General bed mobility comments: NT, pt in recliner  Transfers Overall transfer level: Needs assistance Equipment used: Rolling walker (2 wheeled) Transfers: Sit to/from Stand Sit to Stand: Min assist;From elevated surface         General transfer comment: Mod verbal and tactile cues for sequencing to  maintain hip precautions during transfer training from various height surfaces  Ambulation/Gait Ambulation/Gait assistance: Min guard Ambulation Distance (Feet): 50 Feet Assistive device: Rolling walker (2 wheeled) Gait Pattern/deviations: Step-to pattern;Trunk flexed;Decreased step length - right;Decreased stance time - left Gait velocity: Decreased   General Gait Details: Decreased antalgic gait on the LLE this session with slow, step to cadence and mod to max verbal cues for upright posture and amb closer to [employer]   Stairs             Wheelchair Mobility    Modified Rankin (Stroke Patients Only)       Balance Overall balance assessment: Needs assistance Sitting-balance support: Feet unsupported;Bilateral upper extremity supported Sitting balance-Leahy Scale: Good     Standing balance support: Bilateral upper extremity supported Standing balance-Leahy Scale: Fair                              Cognition Arousal/Alertness: Awake/alert Behavior During Therapy: WFL for tasks assessed/performed Overall Cognitive Status: Within Functional Limits for tasks assessed                                        Exercises Total Joint Exercises Ankle Circles/Pumps: AROM;Both;10 reps;15 reps Quad Sets: Strengthening;Both;5 reps;10 reps Heel Slides: AAROM;5 reps;Both;AROM Hip ABduction/ADduction: AAROM;10 reps;Strengthening;Both(AAROM on the LLE) Straight Leg Raises: AAROM;Left;10 reps Long Arc Quad: AROM;Both;10 reps;15 reps Knee Flexion: AROM;Both;10 reps;15 reps Marching  in Standing: AROM;Both;5 reps;Standing Other Exercises Other Exercises: HEP review with family and pt per handout Other Exercises: Hip precaution review with pt and family including during functional tasks Other Exercises: 90 deg L turn training with pt and family to prevent CKD L hip IR    General Comments        Pertinent Vitals/Pain Pain Assessment: No/denies pain     Home Living                      Prior Function            PT Goals (current goals can now be found in the care plan section) Progress towards PT goals: Progressing toward goals    Frequency    BID      PT Plan Current plan remains appropriate    Co-evaluation              AM-PAC PT "6 Clicks" Daily Activity  Outcome Measure                   End of Session Equipment Utilized During Treatment: Gait belt Activity Tolerance: Patient tolerated treatment well Patient left: in chair;with call bell/phone within reach;with chair alarm set;with family/visitor present;Other (comment)(CNA entering for hygiene care) Nurse Communication: Mobility status PT Visit Diagnosis: Muscle weakness (generalized) (M62.81);Other abnormalities of gait and mobility (R26.89);History of falling (Z91.81)     Time: 1520-1600 PT Time Calculation (min) (ACUTE ONLY): 40 min  Charges:  $Gait Training: 23-37 mins $Therapeutic Exercise: 8-22 mins $Therapeutic Activity: 8-22 mins                    G Codes:       DElly Modena PT, DPT 04/03/18, 4:15 PM

## 2018-04-03 NOTE — Discharge Summary (Addendum)
Physician Discharge Summary  Patient ID: CHENOA LUDDY MRN: [id number] DOB/AGE: 1934-07-23 84 y.o.  Admit date: 04/01/2018 Discharge date: 04/04/2018  Admission Diagnoses:  M25.552 Pain in left hip T85.9XXA Unsp complication of internal prosth dev/grft, init <principal problem not specified>  Discharge Diagnoses:  M25.552 Pain in left hip T85.9XXA Unsp complication of internal prosth dev/grft, init Active Problems:   Breakdown (mechanical) of internal fixation device of left femur, initial encounter [facility])   Past Medical History:  Diagnosis Date  . Anxiety   . Arthritis   . Complication of anesthesia    AGE 38 AFTER DOUBLE MASTECTOMIES SEIZURE ACTIVITY IN THIGH. NO PROBLEM SINCE  . CRI (chronic renal insufficiency)    STAGE 3  . Depression   . Eczema   . GERD (gastroesophageal reflux disease)   . Heart murmur   . HTN (hypertension)   . Sclerosing adenosis of breast     Surgeries: Procedure(s): HARDWARE REMOVAL-LEFT HIP SYNTHES NAIL TOTAL HIP ARTHROPLASTY- POSTERIOR on 04/01/2018   Consultants (if any):   Discharged Condition: Improved  Hospital Course: SANGITA ZANI is an 84 y.o. female who was admitted 04/01/2018 with a diagnosis of  M25.552 Pain in left hip T85.9XXA Unsp complication of internal prosth dev/grft, init <principal problem not specified> and went to the operating room on 04/01/2018 and underwent the above named procedures.    She was given perioperative antibiotics:  Anti-infectives (From admission, onward)   Start     Dose/Rate Route Frequency Ordered Stop   04/01/18 1700  ceFAZolin (ANCEF) IVPB 1 g/50 mL premix     1 g 100 mL/hr over 30 Minutes Intravenous Every 6 hours 04/01/18 1544 04/01/18 2333   04/01/18 1122  50,000 units bacitracin in 0.9% normal saline 250 mL irrigation  Status:  Discontinued       As needed 04/01/18 1209 04/01/18 1333   04/01/18 1002  ceFAZolin (ANCEF) 2-3 GM-%(50ML) IVPB SOLR    Note to Pharmacy:  Chaya Jan    : cabinet override      04/01/18 1002 04/01/18 2214   04/01/18 0600  ceFAZolin (ANCEF) IVPB 2g/100 mL premix     2 g 200 mL/hr over 30 Minutes Intravenous On call to O.R. 03/31/18 2144 04/01/18 1051    .  She was given sequential compression devices, early ambulation, and ECASA for DVT prophylaxis.  She benefited maximally from the hospital stay and there were no complications.    Recent vital signs:  Vitals:   04/02/18 2355 04/03/18 0721  BP: 124/61 127/70  Pulse: 78 84  Resp: 19 18  Temp: 98.5 F (36.9 C) 98.5 F (36.9 C)  SpO2: 95% 96%    Recent laboratory studies:  Lab Results  Component Value Date   HGB 10.9 (L) 04/03/2018   HGB 12.7 04/02/2018   HGB 13.6 04/01/2018   Lab Results  Component Value Date   WBC 9.5 04/03/2018   PLT 253 04/03/2018   Lab Results  Component Value Date   INR 1.00 04/01/2018   Lab Results  Component Value Date   NA 132 (L) 04/02/2018   K 4.6 04/02/2018   CL 99 (L) 04/02/2018   CO2 26 04/02/2018   BUN 17 04/02/2018   CREATININE 0.83 04/02/2018   GLUCOSE 137 (H) 04/02/2018    Discharge Medications:   Allergies as of 04/03/2018      Reactions   Augmentin [amoxicillin-pot Clavulanate] Diarrhea   Has patient had a PCN reaction causing immediate rash, facial/tongue/throat swelling, SOB or  lightheadedness with hypotension: No Has patient had a PCN reaction causing severe rash involving mucus membranes or skin necrosis: No Has patient had a PCN reaction that required hospitalization: No Has patient had a PCN reaction occurring within the last 10 years: No If all of the above answers are "NO", then may proceed with Cephalosporin use.   Clindamycin/lincomycin Other (See Comments)   Reaction: unknown   Demerol [meperidine] Other (See Comments)   Reaction: dizziness   Percocet [oxycodone-acetaminophen] Nausea Only, Other (See Comments)   Reaction: unknown      Medication List    STOP taking these medications   enoxaparin 40  MG/0.4ML injection Commonly known as:  LOVENOX     TAKE these medications   aspirin 81 MG chewable tablet Chew 1 tablet (81 mg total) by mouth 2 (two) times daily.   atenolol 50 MG tablet Commonly known as:  TENORMIN TAKE 1 TABLET BY MOUTH EACH DAY   Baclofen 5 MG Tabs Take 5 mg by mouth 3 (three) times daily as needed for muscle spasms.   diazepam 5 MG tablet Commonly known as:  VALIUM Take 2.5 mg by mouth every 6 (six) hours as needed for muscle spasms.   docusate sodium 100 MG capsule Commonly known as:  COLACE Take 1 capsule (100 mg total) by mouth 2 (two) times daily as needed for mild constipation.   HYDROcodone-acetaminophen 5-325 MG tablet Commonly known as:  NORCO/VICODIN Take 1 tablet by mouth every 4 (four) hours as needed for moderate pain. What changed:    how much to take  reasons to take this   lisinopril 40 MG tablet Commonly known as:  PRINIVIL,ZESTRIL Take 40 mg by mouth daily. for blood pressure   PARoxetine 20 MG tablet Commonly known as:  PAXIL Take 20 mg by mouth daily.       Diagnostic Studies: Dg Pelvis Portable  Result Date: 04/01/2018 CLINICAL DATA:  Status post left total hip joint prosthesis placement. EXAM: PORTABLE PELVIS 1-2 VIEWS COMPARISON:  Left hip series of February 19, 2018 FINDINGS: The patient has undergone removal of the telescoping screw and intramedullary rod which have been placed for a previous intertrochanteric fracture. The patient has undergone placement of a total joint prosthesis. The orientation of the acetabular cup is nearly horizontal with the dome positioned superiorly. The femoral component of the prosthesis appears appropriately positioned with the interface with the native bone being unremarkable. IMPRESSION: Status post hardware removal from previous ORIF and placement of total hip joint prosthesis. Correlation clinically as to the adequacy of the positioning of the acetabular cup is needed. The femoral component of  the prosthesis is unremarkable. Electronically Signed   By: David  Swazil[place]Jordan M.D.   On: 04/01/2018 14:18    Disposition: Discharge disposition: 03-Skilled Nursing Facility         Contact information for after-discharge care    Destination    [facility] .   Service:  Skilled Nursing Contact information: [address] [place] [id number] [phone]               Signed: Lyndle HerrlichJames R Anan Dapolito ,MD 04/03/2018, 8:05 AM

## 2018-04-03 NOTE — Discharge Instructions (Signed)
Continue posterior hip precautions, she may be weight bearing as tolerated on the left lower extremity  Shower is OK with dressing in place  Patient to use abduction pillow at night when sleeping  Call with fever > 101.5 degrees F, drainage from the wound, or shortness of breath  Dr. Odis LusterBowers [phone]

## 2018-04-03 NOTE — Progress Notes (Signed)
Plan is for patient to D/[facility] tomorrow pending medical clearance. [facility] is aware of above.   Baker Hughes IncorporatedBailey Roisin Mones, LCSW [phone]

## 2018-04-04 LAB — CBC
HCT: 30.2 % — ABNORMAL LOW (ref 35.0–47.0)
Hemoglobin: 10.5 g/dL — ABNORMAL LOW (ref 12.0–16.0)
MCH: 31.9 pg (ref 26.0–34.0)
MCHC: 34.8 g/dL (ref 32.0–36.0)
MCV: 91.7 fL (ref 80.0–100.0)
PLATELETS: 253 10*3/uL (ref 150–440)
RBC: 3.29 MIL/uL — ABNORMAL LOW (ref 3.80–5.20)
RDW: 13.7 % (ref 11.5–14.5)
WBC: 8.9 10*3/uL (ref 3.6–11.0)

## 2018-04-04 NOTE — Progress Notes (Signed)
Physical Therapy Treatment Patient Details Name: Lisa Stokes MRN: [id number] DOB: [birth year] Today's Date: 04/04/2018    History of Present Illness Pt is an 84 yo F diagnosed with complication of internal prosthetic device and is s/p removal of L hip nail with new L hip THA.  PMH includes: HTN, GERD, and renal disease.    PT Comments    Pt presents with deficits in strength, transfers, mobility, gait, balance, and activity tolerance but continues to progress slowly towards goals.  Posterior hip precaution education provided to pt and family verbally and during functional tasks with pt demonstrating poor carryover with proper sequencing during tasks.  Pt continues to require extensive assistance with bed mobility tasks but was CGA during sit to/from stand transfers this session.  Pt was able to amb 1 x 80' and 1 x 40' with a RW and close CGA with extensive verbal cues for sequencing.  Pt continues to c/o feeling as if her LLE is longer than her RLE with R side of her pelvis noted to be grossly slightly higher than the L side in standing indicating a possible functional leg length discrepancy.  Pt will benefit from PT services in a SNF setting upon discharge to safely address above deficits for decreased caregiver assistance and eventual return to PLOF.       Follow Up Recommendations  SNF;Supervision for mobility/OOB     Equipment Recommendations  None recommended by PT    Recommendations for Other Services       Precautions / Restrictions Precautions Precautions: Posterior Hip;Fall Precaution Booklet Issued: Yes (comment) Precaution Comments: Posterior hip precaution education and review provided to pt and daughter Restrictions Weight Bearing Restrictions: Yes LLE Weight Bearing: Weight bearing as tolerated    Mobility  Bed Mobility Overal bed mobility: Needs Assistance Bed Mobility: Supine to Sit;Sit to Supine     Supine to sit: Max assist Sit to supine: Max assist   General bed mobility comments: Log roll technique with bed mobility with pillow between the knees to prevent L hip Add  Transfers Overall transfer level: Needs assistance Equipment used: Rolling walker (2 wheeled) Transfers: Sit to/from Stand Sit to Stand: From elevated surface;Min guard         General transfer comment: Mod verbal and tactile cues for sequencing to maintain hip precautions during transfer training from various height surfaces with poor carryover  Ambulation/Gait Ambulation/Gait assistance: Min guard Ambulation Distance (Feet): 80 Feet Assistive device: Rolling walker (2 wheeled) Gait Pattern/deviations: Step-to pattern;Trunk flexed;Decreased step length - right;Decreased stance time - left Gait velocity: Decreased   General Gait Details: Slow, step to cadence and mod to max verbal cues for upright posture and amb closer to [employer]    Modified Rankin (Stroke Patients Only)       Balance Overall balance assessment: Needs assistance Sitting-balance support: Feet unsupported;Bilateral upper extremity supported Sitting balance-Leahy Scale: Good     Standing balance support: Bilateral upper extremity supported Standing balance-Leahy Scale: Fair                              Cognition Arousal/Alertness: Awake/alert Behavior During Therapy: WFL for tasks assessed/performed Overall Cognitive Status: Within Functional Limits for tasks assessed  Exercises Total Joint Exercises Ankle Circles/Pumps: AROM;Both;10 reps;15 reps Heel Slides: Both;5 reps Hip ABduction/ADduction: AAROM;10 reps;Strengthening;Both Straight Leg Raises: AAROM;Left;10 reps Long Arc Quad: AROM;Both;10 reps;15 reps Knee Flexion: AROM;Both;10 reps;15 reps Marching in Standing: AROM;Both;5 reps;Standing Other Exercises Other Exercises: 90 deg L turn training with pt and family  to prevent CKD L hip IR Other Exercises: Hip precaution review with pt and family including during functional tasks    General Comments        Pertinent Vitals/Pain Pain Assessment: No/denies pain    Home Living                      Prior Function            PT Goals (current goals can now be found in the care plan section) Progress towards PT goals: Progressing toward goals    Frequency    BID      PT Plan Current plan remains appropriate    Co-evaluation              AM-PAC PT "6 Clicks" Daily Activity  Outcome Measure                   End of Session Equipment Utilized During Treatment: Gait belt Activity Tolerance: Patient tolerated treatment well Patient left: with family/visitor present;in bed;with call bell/phone within reach;with bed alarm set Nurse Communication: Mobility status PT Visit Diagnosis: Muscle weakness (generalized) (M62.81);Other abnormalities of gait and mobility (R26.89);History of falling (Z91.81)     Time: [id number] PT Time Calculation (min) (ACUTE ONLY): 45 min  Charges:  $Gait Training: 8-22 mins $Therapeutic Exercise: 8-22 mins $Therapeutic Activity: 8-22 mins                    G Codes:       Elly Modena. Scott Deshawn Skelley PT, DPT 04/04/18, 12:22 PM

## 2018-04-04 NOTE — Progress Notes (Signed)
[facility]. [place]. Iv removed, belongings packed, NT to prepare patient for transport via EMS. EMS called. AVS printed.

## 2018-04-04 NOTE — Clinical Social Work Placement (Signed)
   CLINICAL SOCIAL WORK PLACEMENT  NOTE  Date:  04/04/2018  Patient Details  Name: Lisa Stokes MRN: [id number] Date of Birth: 11-10-1934  Clinical Social Work is seeking post-discharge placement for this patient at the Skilled  Nursing Facility level of care (*CSW will initial, date and re-position this form in  chart as items are completed):  Yes   Patient/family provided with [facility] Clinical Social Work Department's list of facilities offering this level of care within the geographic area requested by the patient (or if unable, by the patient's family).  Yes   Patient/family informed of their freedom to choose among providers that offer the needed level of care, that participate in Medicare, Medicaid or managed care program needed by the patient, have an available bed and are willing to accept the patient.  Yes   Patient/family informed of [facility]'s ownership interest in [facility] and [facility], as well as of the fact that they are under no obligation to receive care at these facilities.  PASRR submitted to EDS on       PASRR number received on       Existing PASRR number confirmed on 04/02/18     FL2 transmitted to all facilities in geographic area requested by pt/family on 04/02/18     FL2 transmitted to all facilities within larger geographic area on       Patient informed that his/her managed care company has contracts with or will negotiate with certain facilities, including the following:        Yes   Patient/family informed of bed offers received.  Patient chooses bed at [facility])     Physician recommends and patient chooses bed at [facility](SNF)    Patient to be transferred to [facility]) on 04/04/18.  Patient to be transferred to facility by [facility] )     Patient family notified on 04/04/18 of transfer.  Name of family member notified:  (Patient's daughter [name] is at bedside and aware of D/C today. )     PHYSICIAN     Additional Comment:    _______________________________________________ Telesforo Brosnahan, Darleen CrockerBailey M, LCSW 04/04/2018, 9:39 AM

## 2018-04-04 NOTE — Progress Notes (Addendum)
Patient is medically stable for D/C to [facility] today. Per [name] at [facility] patient can come today to room 212. RN will call report at [phone] and arrange EMS for transport. Clinical Child psychotherapistocial Worker (CSW) sent [facility] orders [facility] via [employer]. Medicare bundle case manager [name] is aware of above. Patient is aware of above. Patient's daughter [name] is at bedside and aware of above. Please reconsult if future social work needs arise. CSW signing off.   [employer]Bailey Anella Nakata, LCSW [phone]

## 2018-04-08 DIAGNOSIS — I10 Essential (primary) hypertension: Secondary | ICD-10-CM

## 2018-04-08 DIAGNOSIS — K219 Gastro-esophageal reflux disease without esophagitis: Secondary | ICD-10-CM | POA: Diagnosis not present

## 2018-04-08 DIAGNOSIS — T84091A Other mechanical complication of internal left hip prosthesis, initial encounter: Secondary | ICD-10-CM | POA: Diagnosis not present

## 2018-04-08 DIAGNOSIS — F39 Unspecified mood [affective] disorder: Secondary | ICD-10-CM | POA: Diagnosis not present

## 2018-04-28 ENCOUNTER — Encounter: Payer: Self-pay | Admitting: Medical Oncology

## 2018-04-28 ENCOUNTER — Other Ambulatory Visit: Payer: Self-pay

## 2018-04-28 ENCOUNTER — Emergency Department
Admission: EM | Admit: 2018-04-28 | Discharge: 2018-04-28 | Disposition: A | Discharge status: Home / Self Care | Payer: Medicare Other | Attending: Emergency Medicine | Admitting: Emergency Medicine

## 2018-04-28 DIAGNOSIS — R55 Syncope and collapse: Secondary | ICD-10-CM | POA: Diagnosis not present

## 2018-04-28 DIAGNOSIS — Z79899 Other long term (current) drug therapy: Secondary | ICD-10-CM | POA: Insufficient documentation

## 2018-04-28 DIAGNOSIS — R531 Weakness: Secondary | ICD-10-CM | POA: Diagnosis present

## 2018-04-28 DIAGNOSIS — I10 Essential (primary) hypertension: Secondary | ICD-10-CM | POA: Insufficient documentation

## 2018-04-28 DIAGNOSIS — N390 Urinary tract infection, site not specified: Secondary | ICD-10-CM | POA: Insufficient documentation

## 2018-04-28 LAB — CBC WITH DIFFERENTIAL/PLATELET
BASOS ABS: 0.3 10*3/uL — AB (ref 0–0.1)
Basophils Relative: 3 %
EOS ABS: 0.6 10*3/uL (ref 0–0.7)
EOS PCT: 7 %
HCT: 37.7 % (ref 35.0–47.0)
Hemoglobin: 13.1 g/dL (ref 12.0–16.0)
Lymphocytes Relative: 10 %
Lymphs Abs: 0.9 10*3/uL — ABNORMAL LOW (ref 1.0–3.6)
MCH: 31 pg (ref 26.0–34.0)
MCHC: 34.6 g/dL (ref 32.0–36.0)
MCV: 89.7 fL (ref 80.0–100.0)
MONO ABS: 0.6 10*3/uL (ref 0.2–0.9)
Monocytes Relative: 6 %
Neutro Abs: 7 10*3/uL — ABNORMAL HIGH (ref 1.4–6.5)
Neutrophils Relative %: 74 %
PLATELETS: 365 10*3/uL (ref 150–440)
RBC: 4.21 MIL/uL (ref 3.80–5.20)
RDW: 14 % (ref 11.5–14.5)
WBC: 9.4 10*3/uL (ref 3.6–11.0)

## 2018-04-28 LAB — URINALYSIS, COMPLETE (UACMP) WITH MICROSCOPIC
BACTERIA UA: NONE SEEN
Bilirubin Urine: NEGATIVE
GLUCOSE, UA: NEGATIVE mg/dL
KETONES UR: NEGATIVE mg/dL
NITRITE: NEGATIVE
PROTEIN: 30 mg/dL — AB
Specific Gravity, Urine: 1.012 (ref 1.005–1.030)
WBC, UA: >50 WBC/hpf — ABNORMAL HIGH (ref 0–5)
pH: 6 (ref 5.0–8.0)

## 2018-04-28 LAB — BASIC METABOLIC PANEL
ANION GAP: 9 (ref 5–15)
BUN: 14 mg/dL (ref 6–20)
CO2: 24 mmol/L (ref 22–32)
Calcium: 8.8 mg/dL — ABNORMAL LOW (ref 8.9–10.3)
Chloride: 101 mmol/L (ref 101–111)
Creatinine, Ser: 1.03 mg/dL — ABNORMAL HIGH (ref 0.44–1.00)
GFR calc Af Amer: 57 mL/min — ABNORMAL LOW (ref >60)
GFR, EST NON AFRICAN AMERICAN: 50 mL/min — AB (ref >60)
GLUCOSE: 143 mg/dL — AB (ref 65–99)
POTASSIUM: 3.8 mmol/L (ref 3.5–5.1)
SODIUM: 134 mmol/L — AB (ref 135–145)

## 2018-04-28 LAB — TROPONIN I
TROPONIN I: 0.03 ng/mL — AB (ref <0.03)
Troponin I: 0.03 ng/mL (ref <0.03)

## 2018-04-28 MED ORDER — CEPHALEXIN 500 MG PO CAPS: 500.0000 mg | ORAL_CAPSULE | Freq: Three times a day (TID) | ORAL | 0 refills | Status: AC

## 2018-04-28 MED ORDER — SODIUM CHLORIDE 0.9 % IV BOLUS
1000.0000 mL | Freq: Once | INTRAVENOUS | Status: AC
  Administered 2018-04-28: 1000 mL via INTRAVENOUS

## 2018-04-28 MED ORDER — CEPHALEXIN 500 MG PO CAPS
500.0000 mg | ORAL_CAPSULE | Freq: Once | ORAL | Status: AC
  Administered 2018-04-28: 500 mg via ORAL
  Filled 2018-04-28: qty 1

## 2018-04-28 NOTE — ED Notes (Signed)
Pt alert and oriented X4, active, cooperative, pt in NAD. RR even and unlabored, color WNL.  Pt informed to return if any life threatening symptoms occur.  Discharge and followup instructions reviewed.  

## 2018-04-28 NOTE — ED Provider Notes (Signed)
[facility] Emergency Department Provider Note  ___________________________________________   First MD Initiated Contact with Patient 04/28/18 1051     (approximate)  I have reviewed the triage vital signs and the nursing notes.   HISTORY  Chief Complaint Weakness and Vomiting   HPI Lisa Stokes is a 84 y.o. female the recent left-sided hip replacement who is presenting to the emergency department today after an episode of near syncope with vomiting.  She says that she was doing exercises with physical therapy when she started to become lightheaded.  She then vomited.  Patient denies any chest pain or palpitations.  Says feels back to her baseline at this time.  Ate a normal breakfast this morning which was a muffin.  Eating and drinking normally lately.  Denies any history of similar syncopal or presyncopal events.  Past Medical History:  Diagnosis Date  . Anxiety   . Arthritis   . Complication of anesthesia    AGE 31 AFTER DOUBLE MASTECTOMIES SEIZURE ACTIVITY IN THIGH. NO PROBLEM SINCE  . CRI (chronic renal insufficiency)    STAGE 3  . Depression   . Eczema   . GERD (gastroesophageal reflux disease)   . Heart murmur   . HTN (hypertension)   . Sclerosing adenosis of breast     Patient Active Problem List   Diagnosis Date Noted  . Breakdown (mechanical) of internal fixation device of left femur, initial encounter (HCC) 04/01/2018  . Hip fracture (HCC) 02/15/2018    Past Surgical History:  Procedure Laterality Date  . HARDWARE REMOVAL Left 04/01/2018   Procedure: HARDWARE REMOVAL-LEFT HIP SYNTHES NAIL;  Surgeon: Lyndle Herrlich, MD;  Location: [facility] ORS;  Service: Orthopedics;  Laterality: Left;  . HEMORRHOID SURGERY     external  . INTRAMEDULLARY (IM) NAIL INTERTROCHANTERIC Left 02/16/2018   Procedure: INTRAMEDULLARY (IM) NAIL INTERTROCHANTRIC;  Surgeon: Deeann Saint, MD;  Location: [facility] ORS;  Service: Orthopedics;  Laterality: Left;    . JOINT REPLACEMENT    . MASTECTOMY Bilateral    FOR SCLEROSING ADENOSIS  . TOTAL HIP ARTHROPLASTY Left 04/01/2018   Procedure: TOTAL HIP ARTHROPLASTY- POSTERIOR;  Surgeon: Lyndle Herrlich, MD;  Location: [facility] ORS;  Service: Orthopedics;  Laterality: Left;    Prior to Admission medications   Medication Sig Start Date End Date Taking? Authorizing Provider  acetaminophen (TYLENOL) 650 MG CR tablet Take 650 mg by mouth daily as needed for pain.   Yes [provider]  aspirin 81 MG chewable tablet Chew 1 tablet (81 mg total) by mouth 2 (two) times daily. Patient taking differently: Chew 81 mg by mouth daily.  04/03/18  Yes Lyndle Herrlich, MD  atenolol (TENORMIN) 50 MG tablet TAKE 1 TABLET BY MOUTH EACH DAY 09/19/15  Yes [provider]  lisinopril (PRINIVIL,ZESTRIL) 40 MG tablet Take 40 mg by mouth daily. for blood pressure 01/29/18  Yes [provider]  Methenamine-Sodium Salicylate 162-162.5 MG TABS Take 2 tablets by mouth 3 (three) times daily as needed (painful urination).   Yes [provider]  PARoxetine (PAXIL) 20 MG tablet Take 20 mg by mouth daily. 01/29/18  Yes [provider]  Baclofen 5 MG TABS Take 5 mg by mouth 3 (three) times daily as needed for muscle spasms. Patient not taking: Reported on 03/27/2018 02/18/18   Houston Siren, MD  docusate sodium (COLACE) 100 MG capsule Take 1 capsule (100 mg total) by mouth 2 (two) times daily as needed for mild constipation. Patient not  taking: Reported on 03/27/2018 02/18/18   Houston Siren, MD  HYDROcodone-acetaminophen (NORCO/VICODIN) 5-325 MG tablet Take 1 tablet by mouth every 4 (four) hours as needed for moderate pain. Patient not taking: Reported on 04/28/2018 04/03/18 04/03/19  Lyndle Herrlich, MD    Allergies Augmentin [amoxicillin-pot clavulanate]; Clindamycin/lincomycin; Demerol [meperidine]; and Percocet [oxycodone-acetaminophen]  Family History  Problem Relation Age of Onset  . Colon  cancer Father   . Coronary artery disease Brother   . Leukemia Brother   . Stomach cancer Maternal Grandmother   . Breast cancer Maternal Grandmother     Social History Social History   Tobacco Use  . Smoking status: Never Smoker  . Smokeless tobacco: Never Used  Substance Use Topics  . Alcohol use: No    Alcohol/week: 0.0 oz  . Drug use: No    Review of Systems  Constitutional: No fever/chills Eyes: No visual changes. ENT: No sore throat. Cardiovascular: Denies chest pain. Respiratory: Denies shortness of breath. Gastrointestinal: No abdominal pain.  No diarrhea.  No constipation. Genitourinary: Patient reports recent burning with urination as well as decreased frequency. Musculoskeletal: Negative for back pain. Skin: Negative for rash. Neurological: Negative for headaches, focal weakness or numbness.   ____________________________________________   PHYSICAL EXAM:  VITAL SIGNS: ED Triage Vitals  Enc Vitals Group     BP 04/28/18 1046 (!) 151/72     Pulse Rate 04/28/18 1046 70     Resp 04/28/18 1046 18     Temp 04/28/18 1046 97.6 F (36.4 C)     Temp Source 04/28/18 1046 Oral     SpO2 04/28/18 1046 95 %     Weight 04/28/18 1047 172 lb (78 kg)     Height 04/28/18 1047  (1.626 m)     Head Circumference --      Peak Flow --      Pain Score 04/28/18 1046 0     Pain Loc --      Pain Edu? --      Excl. in [place]? --     Constitutional: Alert and oriented. Well appearing and in no acute distress. Eyes: Conjunctivae are normal.  Head: Atraumatic. Nose: No congestion/rhinnorhea. Mouth/Throat: Mucous membranes are moist.  Neck: No stridor.   Cardiovascular: Normal rate, regular rhythm. Grossly normal heart sounds.   Respiratory: Normal respiratory effort.  No retractions. Lungs CTAB. Gastrointestinal: Soft and nontender. No distention. Musculoskeletal: No lower extremity tenderness nor edema.  No joint effusions. Neurologic:  Normal speech and language. No  gross focal neurologic deficits are appreciated. Skin:  Skin is warm, dry and intact. No rash noted. Psychiatric: Mood and affect are normal. Speech and behavior are normal.  ____________________________________________   LABS (all labs ordered are listed, but only abnormal results are displayed)  Labs Reviewed  CBC WITH DIFFERENTIAL/PLATELET - Abnormal; Notable for the following components:      Result Value   Neutro Abs 7.0 (*)    Lymphs Abs 0.9 (*)    Basophils Absolute 0.3 (*)    All other components within normal limits  BASIC METABOLIC PANEL - Abnormal; Notable for the following components:   Sodium 134 (*)    Glucose, Bld 143 (*)    Creatinine, Ser 1.03 (*)    Calcium 8.8 (*)    GFR calc non Af Amer 50 (*)    GFR calc Af Amer 57 (*)    All other components within normal limits  TROPONIN I - Abnormal; Notable for the following components:  Troponin I 0.03 (*)    All other components within normal limits  URINALYSIS, COMPLETE (UACMP) WITH MICROSCOPIC - Abnormal; Notable for the following components:   Color, Urine AMBER (*)    APPearance TURBID (*)    Hgb urine dipstick SMALL (*)    Protein, ur 30 (*)    Leukocytes, UA LARGE (*)    WBC, UA >50 (*)    Squamous Epithelial / LPF >50 (*)    All other components within normal limits  TROPONIN I - Abnormal; Notable for the following components:   Troponin I 0.03 (*)    All other components within normal limits  URINE CULTURE   ____________________________________________  EKG  ED ECG REPORT I, Arelia Longest, the attending physician, personally viewed and interpreted this ECG.   Date: 04/28/2018  EKG Time: 1046  Rate: 70  Rhythm: normal sinus rhythm with PVC x1.  Axis: Normal  Intervals: LVH with repolarization abnormality.  ST&T Change: Single T wave inversion in V5.  Biphasic T waves in 1 and aVL. No significant change from previous  EKGs. ____________________________________________  RADIOLOGY   ____________________________________________   PROCEDURES  Procedure(s) performed:   Procedures  Critical Care performed:   ____________________________________________   INITIAL IMPRESSION / ASSESSMENT AND PLAN / ED COURSE  Pertinent labs & imaging results that were available during my care of the patient were reviewed by me and considered in my medical decision making (see chart for details).  DDX: Vasovagal episode, near syncope, less short abnormality, UTI, anemia As part of my medical decision making, I reviewed the following data within the electronic MEDICAL RECORD NUMBER Notes from prior ED visits  ----------------------------------------- 1:47 PM on 04/28/2018 -----------------------------------------  Patient at this time feels back to her baseline.  Multiple white blood cells as well as large leukocytes with contamination with too many to count squamous epithelial cells.  However, the patient reports urinary tract symptoms.  She will be treated with Keflex.  Urine culture is pending.  Patient is understanding of the diagnosis as well as treatment plan willing to comply.  Likely vasovagal episode potentiated by urinary tract infection. ____________________________________________   FINAL CLINICAL IMPRESSION(S) / ED DIAGNOSES  Near syncope.  UTI.    NEW MEDICATIONS STARTED DURING THIS VISIT:  New Prescriptions   No medications on file     Note:  This document was prepared using Dragon voice recognition software and may include unintentional dictation errors.     Myrna Blazer, MD 04/28/18 [phone]

## 2018-04-28 NOTE — ED Triage Notes (Signed)
Pt from [facility] where she was doing PT and said she stood up and suddenly felt dizzy, weak with diaphoresis and vomiting x 1. Recent left hip replacement. Pt denies pain at this reports she still feels weak.

## 2018-04-30 LAB — URINE CULTURE: Culture: >=100000 — AB

## 2019-03-26 IMAGING — DX DG CHEST 1V PORT
1 series · 1 of 1 positions shown · non-contrast
Comparison: Chest CT, 04/03/2010

CLINICAL DATA: Preop.  No history of heart and lung disease.

EXAM:
PORTABLE CHEST 1 VIEW

[chest ap]
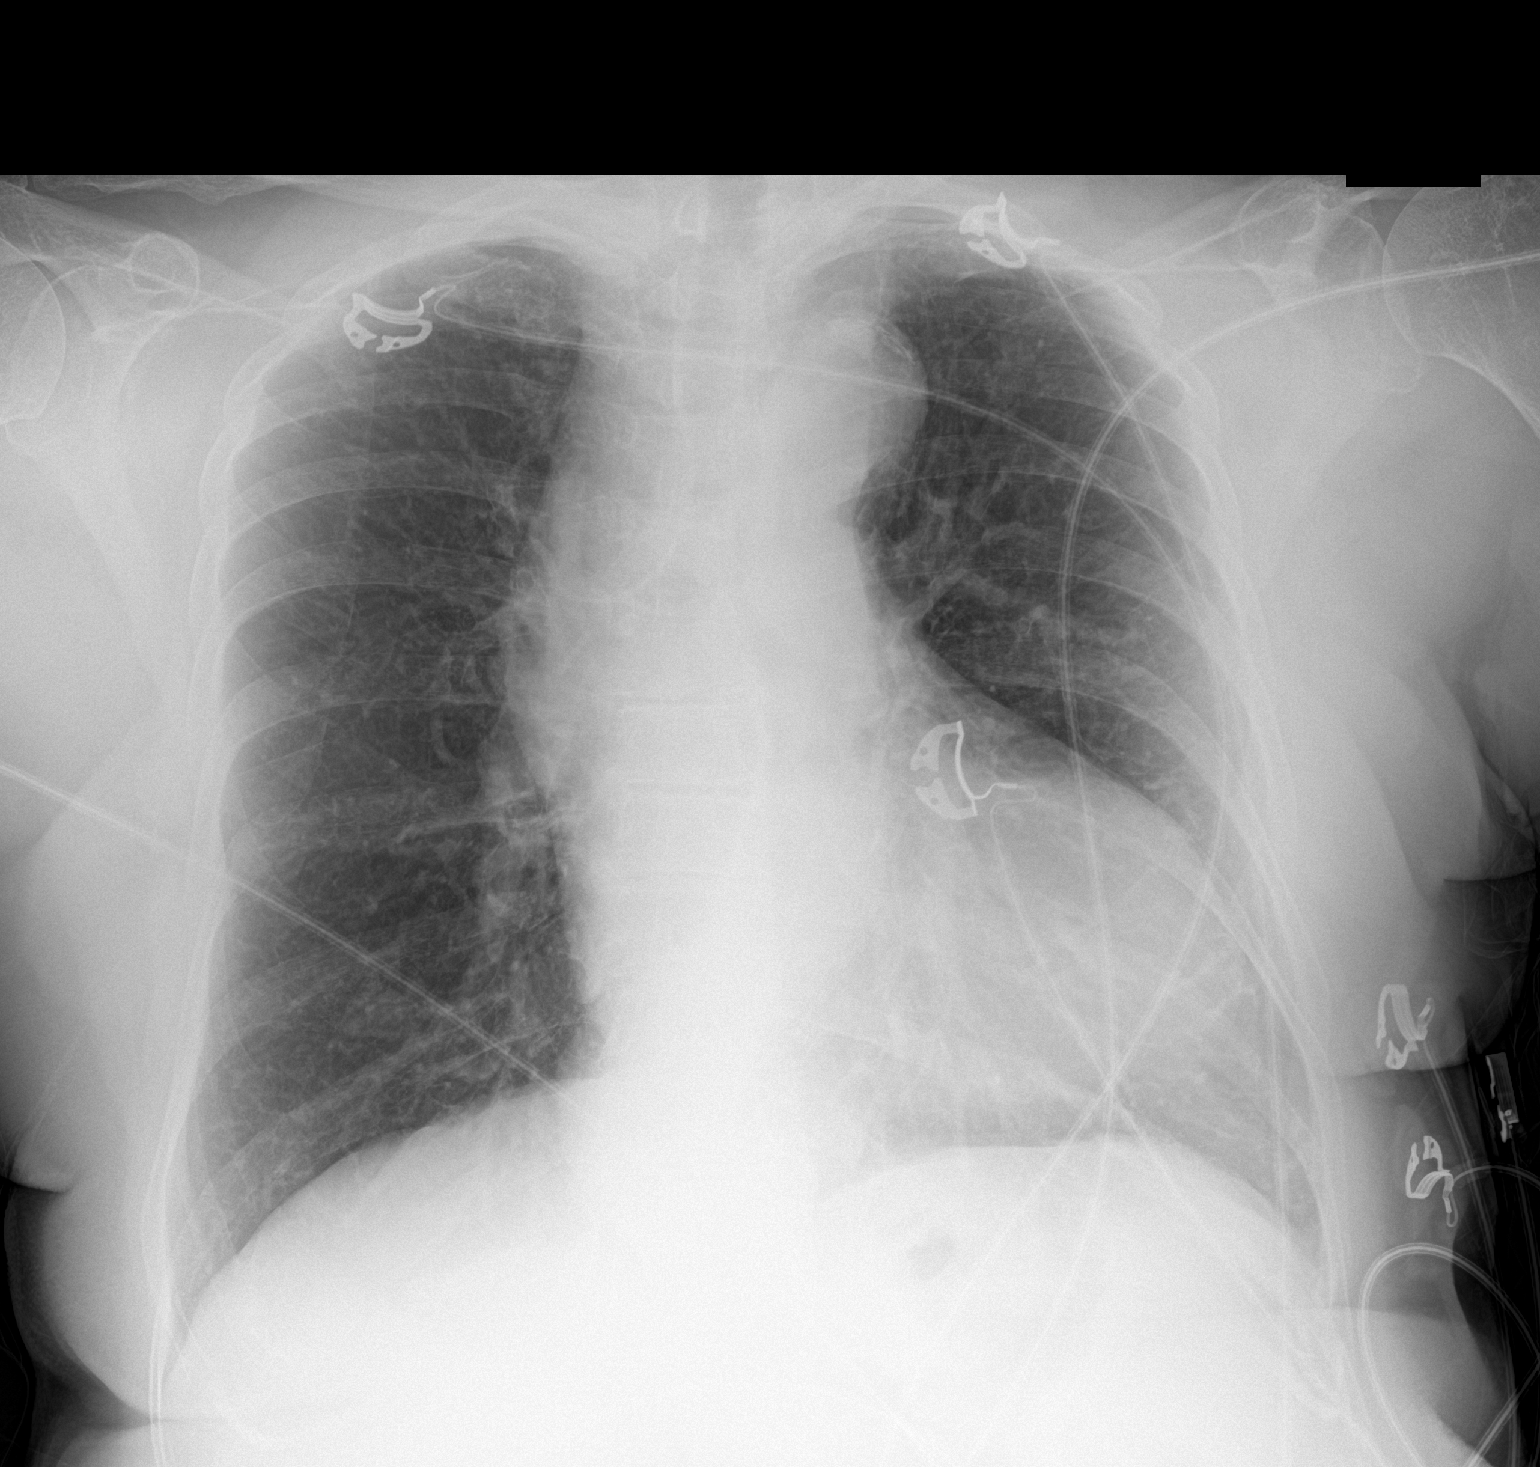

[1 of 1 positions shown; findings below may reference images not displayed]

FINDINGS: Cardiac silhouette is borderline enlarged. No mediastinal or hilar
masses. No convincing adenopathy. There is prominence of the aortic
arch.

Clear lungs.  No pleural effusion or pneumothorax.

Skeletal structures are demineralized but grossly intact.
IMPRESSION: No acute cardiopulmonary disease.

## 2019-03-29 IMAGING — DX DG ANKLE 2V *L*
2 series · 2 of 2 positions shown · non-contrast
Comparison: No recent prior.

CLINICAL DATA: Fall.  Left ankle pain.

EXAM:
LEFT ANKLE - 2 VIEW

[ankle ap]
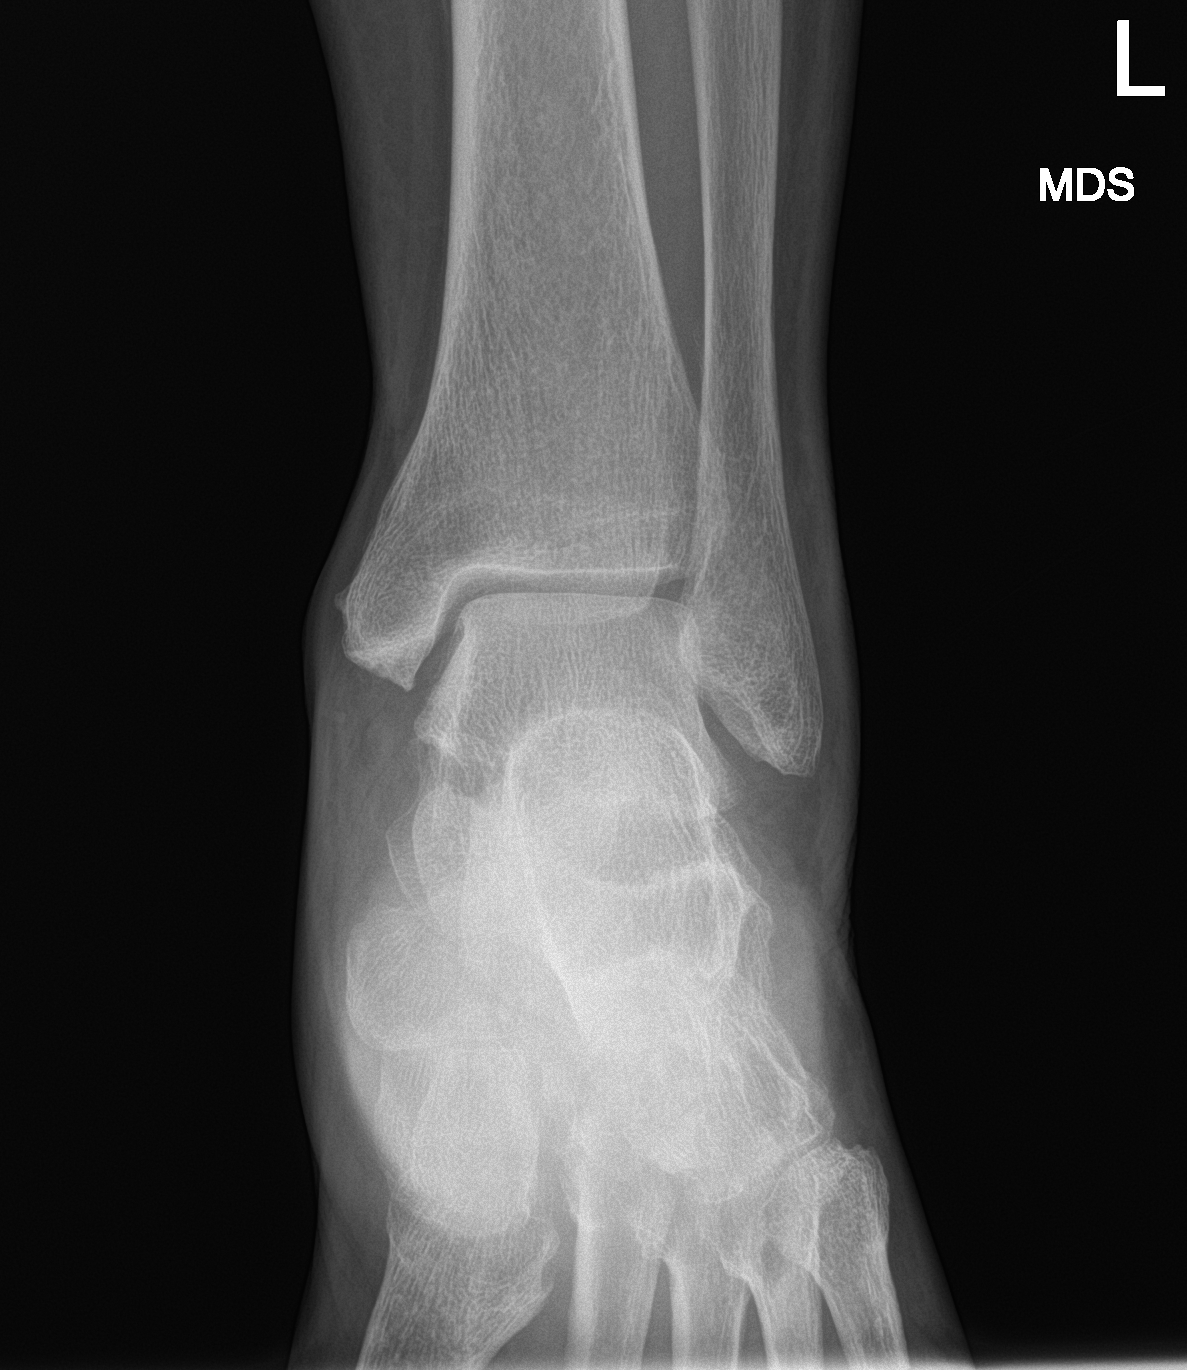

[ankle lat]
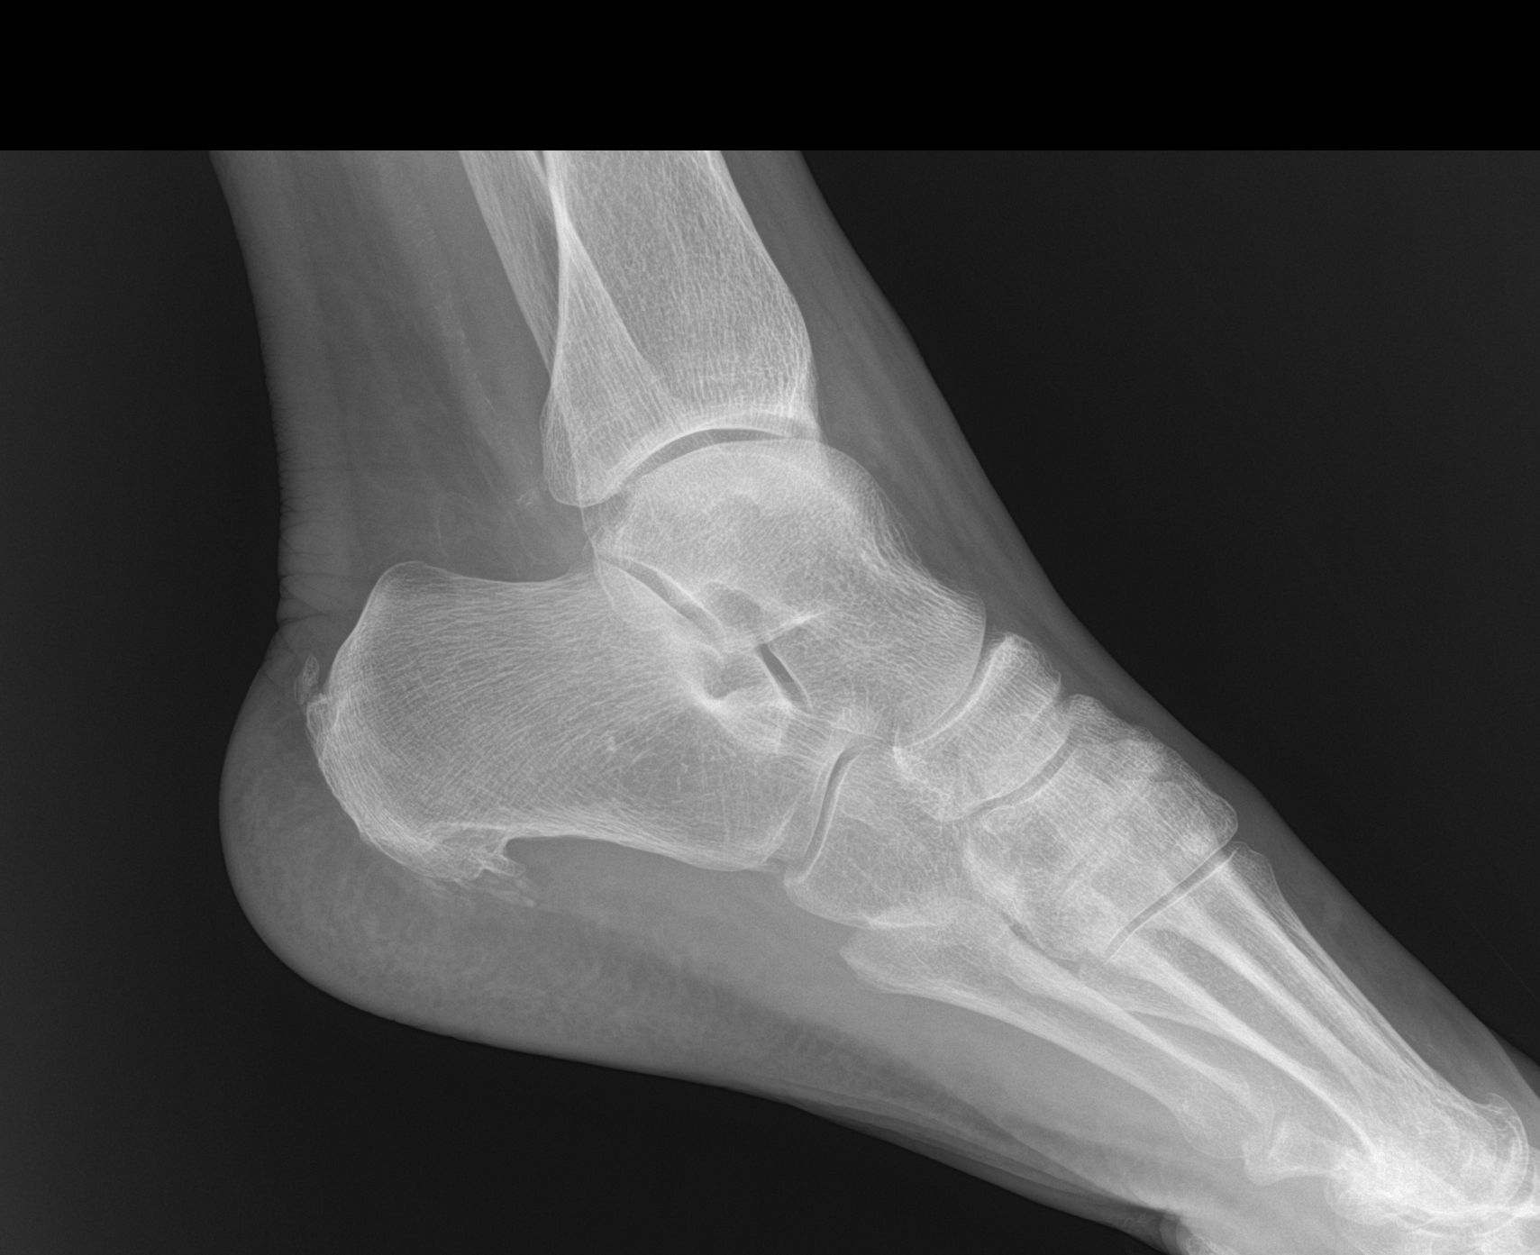

[2 of 2 positions shown; findings below may reference images not displayed]

FINDINGS: Diffuse soft tissue swelling. Diffuse mild degenerative change.
Pain. No acute bony or joint abnormality. Peripheral vascular
calcification.
IMPRESSION: 1.  No acute bony or joint abnormality.

2. Diffuse mild soft tissue swelling. Mild degenerative change.
Calcaneal spurring.

3.  Peripheral vascular disease.

## 2019-08-30 ENCOUNTER — Other Ambulatory Visit: Payer: Self-pay

## 2019-08-30 ENCOUNTER — Emergency Department: Payer: Medicare Other

## 2019-08-30 ENCOUNTER — Emergency Department
Admission: EM | Admit: 2019-08-30 | Discharge: 2019-08-30 | Disposition: A | Discharge status: Home / Self Care | Payer: Medicare Other | Attending: Emergency Medicine | Admitting: Emergency Medicine

## 2019-08-30 DIAGNOSIS — S42212A Unspecified displaced fracture of surgical neck of left humerus, initial encounter for closed fracture: Secondary | ICD-10-CM | POA: Insufficient documentation

## 2019-08-30 DIAGNOSIS — Y92838 Other recreation area as the place of occurrence of the external cause: Secondary | ICD-10-CM | POA: Diagnosis not present

## 2019-08-30 DIAGNOSIS — Y998 Other external cause status: Secondary | ICD-10-CM | POA: Insufficient documentation

## 2019-08-30 DIAGNOSIS — I129 Hypertensive chronic kidney disease with stage 1 through stage 4 chronic kidney disease, or unspecified chronic kidney disease: Secondary | ICD-10-CM | POA: Diagnosis not present

## 2019-08-30 DIAGNOSIS — N183 Chronic kidney disease, stage 3 (moderate): Secondary | ICD-10-CM | POA: Diagnosis not present

## 2019-08-30 DIAGNOSIS — W010XXA Fall on same level from slipping, tripping and stumbling without subsequent striking against object, initial encounter: Secondary | ICD-10-CM | POA: Diagnosis not present

## 2019-08-30 DIAGNOSIS — Z96642 Presence of left artificial hip joint: Secondary | ICD-10-CM | POA: Insufficient documentation

## 2019-08-30 DIAGNOSIS — S4992XA Unspecified injury of left shoulder and upper arm, initial encounter: Secondary | ICD-10-CM | POA: Diagnosis present

## 2019-08-30 DIAGNOSIS — Y9301 Activity, walking, marching and hiking: Secondary | ICD-10-CM | POA: Insufficient documentation

## 2019-08-30 MED ORDER — DIAZEPAM 2 MG PO TABS: 2.0000 mg | ORAL_TABLET | Freq: Three times a day (TID) | ORAL | 0 refills | Status: AC | PRN

## 2019-08-30 MED ORDER — HYDROCODONE-ACETAMINOPHEN 5-325 MG PO TABS: 1.0000 | ORAL_TABLET | Freq: Four times a day (QID) | ORAL | 0 refills | Status: DC | PRN

## 2019-08-30 MED ORDER — HYDROMORPHONE HCL 1 MG/ML IJ SOLN
0.5000 mg | Freq: Once | INTRAMUSCULAR | Status: AC
  Administered 2019-08-30: 18:00:00 0.5 mg via INTRAVENOUS
  Filled 2019-08-30: qty 1

## 2019-08-30 MED ORDER — HYDROMORPHONE HCL 1 MG/ML IJ SOLN
0.5000 mg | Freq: Once | INTRAMUSCULAR | Status: AC
  Administered 2019-08-30: 0.5 mg via INTRAVENOUS
  Filled 2019-08-30: qty 1

## 2019-08-30 MED ORDER — ONDANSETRON HCL 4 MG/2ML IJ SOLN
4.0000 mg | Freq: Once | INTRAMUSCULAR | Status: AC
  Administered 2019-08-30: 17:00:00 4 mg via INTRAVENOUS
  Filled 2019-08-30: qty 2

## 2019-08-30 NOTE — ED Provider Notes (Signed)
[facility] Emergency Department Provider Note       Time seen: ----------------------------------------- 4:52 PM on 08/30/2019 -----------------------------------------   I have reviewed the triage vital signs and the nursing notes.  HISTORY   Chief Complaint Fall (mechanical)    HPI Lisa Stokes is a 85 y.o. female with a history of anxiety, chronic renal sufficiency, depression, hypertension who presents to the ED for a fall that occurred outside while using her walker.  She states she tripped and fell, complains of left arm and shoulder pain.  She was noted to have market swelling around her left shoulder.  She denies any specific pain on arrival.  She denies head injury, hip pain or loss of consciousness.  Past Medical History:  Diagnosis Date  . Anxiety   . Arthritis   . Complication of anesthesia    AGE 13 AFTER DOUBLE MASTECTOMIES SEIZURE ACTIVITY IN THIGH. NO PROBLEM SINCE  . CRI (chronic renal insufficiency)    STAGE 3  . Depression   . Eczema   . GERD (gastroesophageal reflux disease)   . Heart murmur   . HTN (hypertension)   . Sclerosing adenosis of breast     Patient Active Problem List   Diagnosis Date Noted  . Breakdown (mechanical) of internal fixation device of left femur, initial encounter (HCC) 04/01/2018  . Hip fracture (HCC) 02/15/2018    Past Surgical History:  Procedure Laterality Date  . HARDWARE REMOVAL Left 04/01/2018   Procedure: HARDWARE REMOVAL-LEFT HIP SYNTHES NAIL;  Surgeon: Lyndle HerrlichBowers, James R, MD;  Location: [facility] ORS;  Service: Orthopedics;  Laterality: Left;  . HEMORRHOID SURGERY     external  . INTRAMEDULLARY (IM) NAIL INTERTROCHANTERIC Left 02/16/2018   Procedure: INTRAMEDULLARY (IM) NAIL INTERTROCHANTRIC;  Surgeon: Deeann SaintMiller, Howard, MD;  Location: [facility] ORS;  Service: Orthopedics;  Laterality: Left;  . JOINT REPLACEMENT    . MASTECTOMY Bilateral    FOR SCLEROSING ADENOSIS  . TOTAL HIP ARTHROPLASTY Left  04/01/2018   Procedure: TOTAL HIP ARTHROPLASTY- POSTERIOR;  Surgeon: Lyndle HerrlichBowers, James R, MD;  Location: [facility] ORS;  Service: Orthopedics;  Laterality: Left;    Allergies Augmentin [amoxicillin-pot clavulanate], Clindamycin/lincomycin, Demerol [meperidine], and Percocet [oxycodone-acetaminophen]  Social History Social History   Tobacco Use  . Smoking status: Never Smoker  . Smokeless tobacco: Never Used  Substance Use Topics  . Alcohol use: No    Alcohol/week: 0.0 standard drinks  . Drug use: No   Review of Systems Constitutional: Negative for fever. Cardiovascular: Negative for chest pain. Respiratory: Negative for shortness of breath. Gastrointestinal: Negative for abdominal pain, vomiting and diarrhea. Musculoskeletal: Positive for left shoulder pain and swelling Skin: Negative for rash. Neurological: Negative for headaches, focal weakness or numbness.  All systems negative/normal/unremarkable except as stated in the HPI  ____________________________________________   PHYSICAL EXAM:  VITAL SIGNS: ED Triage Vitals  Enc Vitals Group     BP 08/30/19 1650 (!) 171/96     Pulse Rate 08/30/19 1650 99     Resp --      Temp --      Temp src --      SpO2 08/30/19 1650 96 %     Weight --      Height --      Head Circumference --      Peak Flow --      Pain Score 08/30/19 1651 0     Pain Loc --      Pain Edu? --      Excl.  in [place]? --    Constitutional: Alert and oriented.  Mild distress from pain Eyes: Conjunctivae are normal. Normal extraocular movements. ENT      Head: Normocephalic and atraumatic.      Nose: No congestion/rhinnorhea.      Mouth/Throat: Mucous membranes are moist.      Neck: No stridor. Cardiovascular: Normal rate, regular rhythm. No murmurs, rubs, or gallops. Respiratory: Normal respiratory effort without tachypnea nor retractions. Breath sounds are clear and equal bilaterally. No wheezes/rales/rhonchi. Musculoskeletal: Significant swelling is noted to  the left shoulder, severe pain with range of motion of the left shoulder.  Left upper extremity appears to be neurovascularly intact Neurologic:  Normal speech and language. No gross focal neurologic deficits are appreciated.  Skin:  Skin is warm, dry and intact. No rash noted. Psychiatric: Mood and affect are normal. Speech and behavior are normal.  ____________________________________________  ED COURSE:  As part of my medical decision making, I reviewed the following data within the [place] History obtained from family if available, nursing notes, old chart and ekg, as well as notes from prior ED visits. Patient presented for left shoulder pain after a fall, we will assess with labs and imaging as indicated at this time.   Procedures  NNEOMA HARRAL was evaluated in Emergency Department on 08/30/2019 for the symptoms described in the history of present illness. She was evaluated in the context of the global COVID-19 pandemic, which necessitated consideration that the patient might be at risk for infection with the SARS-CoV-2 virus that causes COVID-19. Institutional protocols and algorithms that pertain to the evaluation of patients at risk for COVID-19 are in a state of rapid change based on information released by regulatory bodies including the CDC and federal and state organizations. These policies and algorithms were followed during the patient's care in the ED.  ____________________________________________   RADIOLOGY Images were viewed by me  Left shoulder x-rays  IMPRESSION:  Comminuted impacted fracture involving the lesser tuberosity and  surgical neck of the proximal humerus.  ____________________________________________   DIFFERENTIAL DIAGNOSIS   Humerus fracture, shoulder dislocation, scapular fracture, clavicle fracture  FINAL ASSESSMENT AND PLAN  Proximal humerus fracture   Plan: The patient had presented for a fall. Patient's imaging did  reveal an impacted fracture of the surgical neck of the humerus.  She is neurovascularly intact and was given pain medicine.  We placed her in a shoulder immobilizer and she will follow-up with her orthopedist as an outpatient.   Laurence Aly, MD    Note: This note was generated in part or whole with voice recognition software. Voice recognition is usually quite accurate but there are transcription errors that can and very often do occur. I apologize for any typographical errors that were not detected and corrected.     Earleen Newport, MD 08/30/19 [phone]

## 2019-08-30 NOTE — ED Triage Notes (Signed)
Pt fell outside while using walker after tripping. Pt c/o of left arm/ shoulder pain.

## 2019-08-30 NOTE — ED Notes (Signed)
Pt O2 sat dropped to 93 % on RA, pt put on 2 L [place], O2 sat up to 97%

## 2022-05-15 ENCOUNTER — Observation Stay
Admission: EM | Admit: 2022-05-15 | Discharge: 2022-05-16 | Disposition: A | Discharge status: Home / Self Care | Payer: Medicare Other | Attending: Hospitalist | Admitting: Hospitalist

## 2022-05-15 ENCOUNTER — Inpatient Hospital Stay: Payer: Medicare Other

## 2022-05-15 ENCOUNTER — Emergency Department: Payer: Medicare Other

## 2022-05-15 ENCOUNTER — Other Ambulatory Visit: Payer: Self-pay

## 2022-05-15 DIAGNOSIS — R2681 Unsteadiness on feet: Secondary | ICD-10-CM | POA: Insufficient documentation

## 2022-05-15 DIAGNOSIS — K219 Gastro-esophageal reflux disease without esophagitis: Secondary | ICD-10-CM | POA: Insufficient documentation

## 2022-05-15 DIAGNOSIS — I719 Aortic aneurysm of unspecified site, without rupture: Secondary | ICD-10-CM | POA: Insufficient documentation

## 2022-05-15 DIAGNOSIS — Z8249 Family history of ischemic heart disease and other diseases of the circulatory system: Secondary | ICD-10-CM | POA: Insufficient documentation

## 2022-05-15 DIAGNOSIS — I6782 Cerebral ischemia: Secondary | ICD-10-CM | POA: Diagnosis not present

## 2022-05-15 DIAGNOSIS — R7989 Other specified abnormal findings of blood chemistry: Secondary | ICD-10-CM | POA: Diagnosis not present

## 2022-05-15 DIAGNOSIS — Z66 Do not resuscitate: Secondary | ICD-10-CM | POA: Insufficient documentation

## 2022-05-15 DIAGNOSIS — I351 Nonrheumatic aortic (valve) insufficiency: Secondary | ICD-10-CM | POA: Insufficient documentation

## 2022-05-15 DIAGNOSIS — Z7902 Long term (current) use of antithrombotics/antiplatelets: Secondary | ICD-10-CM | POA: Insufficient documentation

## 2022-05-15 DIAGNOSIS — I493 Ventricular premature depolarization: Secondary | ICD-10-CM | POA: Diagnosis not present

## 2022-05-15 DIAGNOSIS — I7121 Aneurysm of the ascending aorta, without rupture: Secondary | ICD-10-CM | POA: Insufficient documentation

## 2022-05-15 DIAGNOSIS — I129 Hypertensive chronic kidney disease with stage 1 through stage 4 chronic kidney disease, or unspecified chronic kidney disease: Secondary | ICD-10-CM | POA: Insufficient documentation

## 2022-05-15 DIAGNOSIS — F419 Anxiety disorder, unspecified: Secondary | ICD-10-CM | POA: Insufficient documentation

## 2022-05-15 DIAGNOSIS — N183 Chronic kidney disease, stage 3 unspecified: Secondary | ICD-10-CM | POA: Diagnosis not present

## 2022-05-15 DIAGNOSIS — R4701 Aphasia: Secondary | ICD-10-CM | POA: Insufficient documentation

## 2022-05-15 DIAGNOSIS — Z87898 Personal history of other specified conditions: Secondary | ICD-10-CM | POA: Insufficient documentation

## 2022-05-15 DIAGNOSIS — I700 Atherosclerosis of aorta: Secondary | ICD-10-CM | POA: Insufficient documentation

## 2022-05-15 DIAGNOSIS — Z7982 Long term (current) use of aspirin: Secondary | ICD-10-CM | POA: Insufficient documentation

## 2022-05-15 DIAGNOSIS — Z79899 Other long term (current) drug therapy: Secondary | ICD-10-CM | POA: Insufficient documentation

## 2022-05-15 DIAGNOSIS — F32A Depression, unspecified: Secondary | ICD-10-CM | POA: Diagnosis not present

## 2022-05-15 DIAGNOSIS — F329 Major depressive disorder, single episode, unspecified: Secondary | ICD-10-CM | POA: Insufficient documentation

## 2022-05-15 DIAGNOSIS — E559 Vitamin D deficiency, unspecified: Secondary | ICD-10-CM | POA: Insufficient documentation

## 2022-05-15 DIAGNOSIS — I6523 Occlusion and stenosis of bilateral carotid arteries: Secondary | ICD-10-CM | POA: Insufficient documentation

## 2022-05-15 DIAGNOSIS — I491 Atrial premature depolarization: Secondary | ICD-10-CM | POA: Insufficient documentation

## 2022-05-15 DIAGNOSIS — I6502 Occlusion and stenosis of left vertebral artery: Secondary | ICD-10-CM | POA: Insufficient documentation

## 2022-05-15 DIAGNOSIS — R29810 Facial weakness: Secondary | ICD-10-CM | POA: Diagnosis not present

## 2022-05-15 DIAGNOSIS — R9431 Abnormal electrocardiogram [ECG] [EKG]: Secondary | ICD-10-CM | POA: Insufficient documentation

## 2022-05-15 DIAGNOSIS — R41841 Cognitive communication deficit: Secondary | ICD-10-CM | POA: Insufficient documentation

## 2022-05-15 DIAGNOSIS — I1310 Hypertensive heart and chronic kidney disease without heart failure, with stage 1 through stage 4 chronic kidney disease, or unspecified chronic kidney disease: Secondary | ICD-10-CM | POA: Diagnosis not present

## 2022-05-15 DIAGNOSIS — I639 Cerebral infarction, unspecified: Secondary | ICD-10-CM | POA: Diagnosis not present

## 2022-05-15 DIAGNOSIS — Z9013 Acquired absence of bilateral breasts and nipples: Secondary | ICD-10-CM | POA: Insufficient documentation

## 2022-05-15 DIAGNOSIS — Z803 Family history of malignant neoplasm of breast: Secondary | ICD-10-CM | POA: Insufficient documentation

## 2022-05-15 DIAGNOSIS — E538 Deficiency of other specified B group vitamins: Secondary | ICD-10-CM | POA: Diagnosis not present

## 2022-05-15 DIAGNOSIS — I6623 Occlusion and stenosis of bilateral posterior cerebral arteries: Secondary | ICD-10-CM | POA: Diagnosis not present

## 2022-05-15 DIAGNOSIS — E871 Hypo-osmolality and hyponatremia: Secondary | ICD-10-CM | POA: Diagnosis not present

## 2022-05-15 DIAGNOSIS — M6281 Muscle weakness (generalized): Secondary | ICD-10-CM | POA: Insufficient documentation

## 2022-05-15 LAB — APTT: aPTT: 28 seconds (ref 24–36)

## 2022-05-15 LAB — COMPREHENSIVE METABOLIC PANEL
ALT: 13 U/L (ref 0–44)
AST: 21 U/L (ref 15–41)
Albumin: 4.4 g/dL (ref 3.5–5.0)
Alkaline Phosphatase: 55 U/L (ref 38–126)
Anion gap: 12 (ref 5–15)
BUN: 20 mg/dL (ref 8–23)
CO2: 21 mmol/L — ABNORMAL LOW (ref 22–32)
Calcium: 9.3 mg/dL (ref 8.9–10.3)
Chloride: 97 mmol/L — ABNORMAL LOW (ref 98–111)
Creatinine, Ser: 0.82 mg/dL (ref 0.44–1.00)
GFR, Estimated: >60 mL/min (ref >60)
Glucose, Bld: 145 mg/dL — ABNORMAL HIGH (ref 70–99)
Potassium: 4.3 mmol/L (ref 3.5–5.1)
Sodium: 130 mmol/L — ABNORMAL LOW (ref 135–145)
Total Bilirubin: 0.9 mg/dL (ref 0.3–1.2)
Total Protein: 7.6 g/dL (ref 6.5–8.1)

## 2022-05-15 LAB — HEMOGLOBIN A1C
Hgb A1c MFr Bld: 5.5 % (ref 4.8–5.6)
Mean Plasma Glucose: 111.15 mg/dL

## 2022-05-15 LAB — CBC
HCT: 45.2 % (ref 36.0–46.0)
Hemoglobin: 15.9 g/dL — ABNORMAL HIGH (ref 12.0–15.0)
MCH: 31.1 pg (ref 26.0–34.0)
MCHC: 35.2 g/dL (ref 30.0–36.0)
MCV: 88.3 fL (ref 80.0–100.0)
Platelets: 225 10*3/uL (ref 150–400)
RBC: 5.12 MIL/uL — ABNORMAL HIGH (ref 3.87–5.11)
RDW: 11.9 % (ref 11.5–15.5)
WBC: 8 10*3/uL (ref 4.0–10.5)
nRBC: 0 % (ref 0.0–0.2)

## 2022-05-15 LAB — URINALYSIS, ROUTINE W REFLEX MICROSCOPIC
Bilirubin Urine: NEGATIVE
Glucose, UA: NEGATIVE mg/dL
Hgb urine dipstick: NEGATIVE
Ketones, ur: 5 mg/dL — AB
Leukocytes,Ua: NEGATIVE
Nitrite: NEGATIVE
Protein, ur: NEGATIVE mg/dL
Specific Gravity, Urine: 1.023 (ref 1.005–1.030)
pH: 6 (ref 5.0–8.0)

## 2022-05-15 LAB — DIFFERENTIAL
Abs Immature Granulocytes: 0.01 10*3/uL (ref 0.00–0.07)
Basophils Absolute: 0.1 10*3/uL (ref 0.0–0.1)
Basophils Relative: 1 %
Eosinophils Absolute: 0.1 10*3/uL (ref 0.0–0.5)
Eosinophils Relative: 1 %
Immature Granulocytes: 0 %
Lymphocytes Relative: 9 %
Lymphs Abs: 0.7 10*3/uL (ref 0.7–4.0)
Monocytes Absolute: 0.4 10*3/uL (ref 0.1–1.0)
Monocytes Relative: 4 %
Neutro Abs: 6.8 10*3/uL (ref 1.7–7.7)
Neutrophils Relative %: 85 %

## 2022-05-15 LAB — PROTIME-INR
INR: 1.1 (ref 0.8–1.2)
Prothrombin Time: 13.6 seconds (ref 11.4–15.2)

## 2022-05-15 LAB — OSMOLALITY: Osmolality: 272 mOsm/kg — ABNORMAL LOW (ref 275–295)

## 2022-05-15 LAB — SODIUM, URINE, RANDOM: Sodium, Ur: 102 mmol/L

## 2022-05-15 MED ORDER — CLOPIDOGREL BISULFATE 75 MG PO TABS
75.0000 mg | ORAL_TABLET | Freq: Every day | ORAL | Status: DC
  Administered 2022-05-16: 75 mg via ORAL
  Filled 2022-05-15: qty 1

## 2022-05-15 MED ORDER — CLOPIDOGREL BISULFATE 75 MG PO TABS
75.0000 mg | ORAL_TABLET | Freq: Once | ORAL | Status: AC
  Administered 2022-05-15: 75 mg via ORAL
  Filled 2022-05-15: qty 1

## 2022-05-15 MED ORDER — PAROXETINE HCL 30 MG PO TABS
30.0000 mg | ORAL_TABLET | Freq: Every day | ORAL | Status: DC
  Administered 2022-05-15: 30 mg via ORAL
  Filled 2022-05-15 (×2): qty 1

## 2022-05-15 MED ORDER — GADOBUTROL 1 MMOL/ML IV SOLN
7.0000 mL | Freq: Once | INTRAVENOUS | Status: AC | PRN
  Administered 2022-05-15: 7 mL via INTRAVENOUS

## 2022-05-15 MED ORDER — IOHEXOL 350 MG/ML SOLN
75.0000 mL | Freq: Once | INTRAVENOUS | Status: AC | PRN
  Administered 2022-05-15: 75 mL via INTRAVENOUS

## 2022-05-15 MED ORDER — ASPIRIN 81 MG PO CHEW
81.0000 mg | CHEWABLE_TABLET | Freq: Every day | ORAL | Status: DC
  Administered 2022-05-16: 81 mg via ORAL
  Filled 2022-05-15: qty 1

## 2022-05-15 MED ORDER — ACETAMINOPHEN 325 MG PO TABS: 650.0000 mg | ORAL_TABLET | ORAL | Status: DC | PRN

## 2022-05-15 MED ORDER — ASPIRIN 81 MG PO CHEW: 81.0000 mg | CHEWABLE_TABLET | Freq: Every day | ORAL | Status: DC

## 2022-05-15 MED ORDER — ASPIRIN 81 MG PO CHEW
243.0000 mg | CHEWABLE_TABLET | Freq: Once | ORAL | Status: AC
  Administered 2022-05-15: 243 mg via ORAL
  Filled 2022-05-15: qty 3

## 2022-05-15 MED ORDER — ACETAMINOPHEN 650 MG RE SUPP: 650.0000 mg | RECTAL | Status: DC | PRN

## 2022-05-15 MED ORDER — SENNOSIDES-DOCUSATE SODIUM 8.6-50 MG PO TABS: 1.0000 | ORAL_TABLET | Freq: Every evening | ORAL | Status: DC | PRN

## 2022-05-15 MED ORDER — ACETAMINOPHEN 160 MG/5ML PO SOLN: 650.0000 mg | ORAL | Status: DC | PRN

## 2022-05-15 MED ORDER — STROKE: EARLY STAGES OF RECOVERY BOOK: Freq: Once | Status: DC

## 2022-05-15 NOTE — H&P (Incomplete)
History and Physical    Lisa Stokes [id number] DOB: 08-18-1934 DOA: 05/15/2022  PCP: Valera Castle, MD  Patient coming from: home  I have personally briefly reviewed patient's old medical records in [place]  Chief Complaint: c/o slurred speech, generalized weakness, left facial droop with LKW 1600 yesterday  HPI: [name] is a 88 y.o. 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   ED Course:  Vitals: afeb, bp 194/87, hr 73, rr 16 sat 96% Labs: Wbc 8,Hbg 15.9, plt225 NA 130(134), K 4.3, bicarb 21 , glu 145 [id number] with pvc CT head:1. Moderate age related periventricular microvascular ischemic changes. 2. No acute intracranial findings or mass lesions. CTA neck: 1. Severe stenosis or short segment occlusion of the proximal left PCA. 2. Severe proximal right PCA stenosis. 3. Moderate to severe left vertebral artery origin stenosis. 4. Mild carotid atherosclerosis without significant stenosis. 5. 4.9 cm ascending aortic aneurysm, enlarged from 2011. Recommend semi-annual imaging followup by CTA or MRA and referral to cardiothoracic surgery if not already obtained. This recommendation follows 2010 ACCF/AHA/AATS/ACR/ASA/SCA/SCAI/SIR/STS/SVM Guidelines for the Diagnosis and Management of Patients With Thoracic Aortic Disease. Circulation. 2010; [id number]. Aortic aneurysm NOS (ICD10-I71.9) 6. Aortic Atherosclerosis (ICD10-I70.0).  MRI IMPRESSION: Acute or subacute infarcts in the right external capsule and corona radiata.   Tx plavix , asa  Review of Systems: As per HPI otherwise 10 point review of systems negative.   Past Medical History:  Diagnosis Date   Anxiety    Arthritis    Complication  of anesthesia    AGE 86 AFTER DOUBLE MASTECTOMIES SEIZURE ACTIVITY IN THIGH. NO PROBLEM SINCE   CRI (chronic renal insufficiency)    STAGE 3   Depression    Eczema    GERD (gastroesophageal reflux disease)    Heart murmur    HTN (hypertension)    Sclerosing adenosis of breast     Past Surgical History:  Procedure Laterality Date   HARDWARE REMOVAL Left 04/01/2018   Procedure: HARDWARE REMOVAL-LEFT HIP SYNTHES NAIL;  Surgeon: Lovell Sheehan, MD;  Location: [facility] ORS;  Service: Orthopedics;  Laterality: Left;   HEMORRHOID SURGERY     external   INTRAMEDULLARY (IM) NAIL INTERTROCHANTERIC Left 02/16/2018   Procedure: INTRAMEDULLARY (IM) NAIL INTERTROCHANTRIC;  Surgeon: Earnestine Leys, MD;  Location: [facility] ORS;  Service: Orthopedics;  Laterality: Left;   JOINT REPLACEMENT     MASTECTOMY Bilateral    FOR SCLEROSING ADENOSIS   TOTAL HIP ARTHROPLASTY Left 04/01/2018   Procedure: TOTAL HIP ARTHROPLASTY- POSTERIOR;  Surgeon: Lovell Sheehan, MD;  Location: [facility] ORS;  Service: Orthopedics;  Laterality: Left;     reports that she has never smoked. She has never used smokeless tobacco. She reports that she does not drink alcohol and does not use drugs.  Allergies  Allergen Reactions   Augmentin [Amoxicillin-Pot Clavulanate] Diarrhea    Has patient had a PCN reaction causing immediate rash, facial/tongue/throat swelling, SOB or lightheadedness with hypotension: No Has patient had a PCN reaction causing severe rash involving mucus membranes or skin necrosis: No Has patient had a PCN reaction that required hospitalization: No Has patient had a PCN reaction occurring within the last 10 years: No If all of the above answers are "NO", then may proceed with Cephalosporin  use.    Clindamycin/Lincomycin Other (See Comments)    Reaction: unknown   Demerol [Meperidine] Other (See Comments)    Reaction: dizziness   Percocet [Oxycodone-Acetaminophen] Nausea Only and Other (See Comments)    Reaction:  unknown    Family History  Problem Relation Age of Onset   Colon cancer Father    Coronary artery disease Brother    Leukemia Brother    Stomach cancer Maternal Grandmother    Breast cancer Maternal Grandmother     Prior to Admission medications   Medication Sig Start Date End Date Taking? Authorizing Provider  acetaminophen (TYLENOL) 650 MG CR tablet Take 650 mg by mouth daily as needed for pain.    [provider]  aspirin 81 MG chewable tablet Chew 1 tablet (81 mg total) by mouth 2 (two) times daily. Patient taking differently: Chew 81 mg by mouth daily.  04/03/18   Lovell Sheehan, MD  atenolol (TENORMIN) 50 MG tablet TAKE 1 TABLET BY MOUTH EACH DAY 09/19/15   [provider]  Baclofen 5 MG TABS Take 5 mg by mouth 3 (three) times daily as needed for muscle spasms. Patient not taking: Reported on 03/27/2018 02/18/18   Henreitta Leber, MD  docusate sodium (COLACE) 100 MG capsule Take 1 capsule (100 mg total) by mouth 2 (two) times daily as needed for mild constipation. Patient not taking: Reported on 03/27/2018 02/18/18   Henreitta Leber, MD  HYDROcodone-acetaminophen (NORCO/VICODIN) 5-325 MG tablet Take 1 tablet by mouth every 6 (six) hours as needed for moderate pain. 08/30/19   Earleen Newport, MD  lisinopril (PRINIVIL,ZESTRIL) 40 MG tablet Take 40 mg by mouth daily. for blood pressure 01/29/18   [provider]  Methenamine-Sodium Salicylate [id number] MG TABS Take 2 tablets by mouth 3 (three) times daily as needed (painful urination).    [provider]  PARoxetine (PAXIL) 20 MG tablet Take 20 mg by mouth daily. 01/29/18   [provider]    Physical Exam: Vitals:   05/15/22 1113 05/15/22 1513 05/15/22 1640 05/15/22 1830  BP: (!) 194/87 (!) 176/84 (!) 177/82 (!) 168/90  Pulse: 73 78 74 77  Resp: 16 (!) 21 19 (!) 22  Temp: 98.2 F (36.8 C)     TempSrc: Oral     SpO2: 96% 96% 97% 95%     Vitals:   05/15/22 1113 05/15/22 1513  05/15/22 1640 05/15/22 1830  BP: (!) 194/87 (!) 176/84 (!) 177/82 (!) 168/90  Pulse: 73 78 74 77  Resp: 16 (!) 21 19 (!) 22  Temp: 98.2 F (36.8 C)     TempSrc: Oral     SpO2: 96% 96% 97% 95%  Constitutional: NAD, calm, comfortable Eyes: PERRL, lids and conjunctivae normal ENMT: Mucous membranes are moist. Posterior pharynx clear of any exudate or lesions.Normal dentition.  Neck: normal, supple, no masses, no thyromegaly Respiratory: clear to auscultation bilaterally, no wheezing, no crackles. Normal respiratory effort. No accessory muscle use.  Cardiovascular: Regular rate and rhythm, no murmurs / rubs / gallops. No extremity edema. 2+ pedal pulses. No carotid bruits.  Abdomen: no tenderness, no masses palpated. No hepatosplenomegaly. Bowel sounds positive.  Musculoskeletal: no clubbing / cyanosis. No joint deformity upper and lower extremities. Good ROM, no contractures. Normal muscle tone.  Skin: no rashes, lesions, ulcers. No induration Neurologic: CN 2-12 grossly intact. Sensation intact, DTR normal. Strength 5/5 in all 4.  Psychiatric: Normal judgment and insight. Alert and oriented x 3. Normal mood.  Labs on Admission: I have personally reviewed following labs and imaging studies  CBC: Recent Labs  Lab 05/15/22 1116  WBC 8.0  NEUTROABS 6.8  HGB 15.9*  HCT 45.2  MCV 88.3  PLT [id number]   Basic Metabolic Panel: Recent Labs  Lab 05/15/22 1116  NA 130*  K 4.3  CL 97*  CO2 21*  GLUCOSE 145*  BUN 20  CREATININE 0.82  CALCIUM 9.3   GFR: CrCl cannot be calculated (Unknown ideal weight.). Liver Function Tests: Recent Labs  Lab 05/15/22 1116  AST 21  ALT 13  ALKPHOS 55  BILITOT 0.9  PROT 7.6  ALBUMIN 4.4   No results for input(s): LIPASE, AMYLASE in the last 168 hours. No results for input(s): AMMONIA in the last 168 hours. Coagulation Profile: Recent Labs  Lab 05/15/22 1116  INR 1.1   Cardiac Enzymes: No results for input(s): CKTOTAL, CKMB, CKMBINDEX,  TROPONINI in the last 168 hours. BNP (last 3 results) No results for input(s): PROBNP in the last 8760 hours. HbA1C: No results for input(s): HGBA1C in the last 72 hours. CBG: No results for input(s): GLUCAP in the last 168 hours. Lipid Profile: No results for input(s): CHOL, HDL, LDLCALC, TRIG, CHOLHDL, LDLDIRECT in the last 72 hours. Thyroid Function Tests: No results for input(s): TSH, T4TOTAL, FREET4, T3FREE, THYROIDAB in the last 72 hours. Anemia Panel: No results for input(s): VITAMINB12, FOLATE, FERRITIN, TIBC, IRON, RETICCTPCT in the last 72 hours. Urine analysis:    Component Value Date/Time   COLORURINE AMBER (A) 04/28/2018 1048   APPEARANCEUR TURBID (A) 04/28/2018 1048   APPEARANCEUR Clear 10/19/2015 0858   LABSPEC 1.012 04/28/2018 1048   PHURINE 6.0 04/28/2018 1048   GLUCOSEU NEGATIVE 04/28/2018 1048   HGBUR SMALL (A) 04/28/2018 1048   BILIRUBINUR NEGATIVE 04/28/2018 1048   BILIRUBINUR Negative 10/19/2015 0858   KETONESUR NEGATIVE 04/28/2018 1048   PROTEINUR 30 (A) 04/28/2018 1048   NITRITE NEGATIVE 04/28/2018 1048   LEUKOCYTESUR LARGE (A) 04/28/2018 1048   LEUKOCYTESUR Negative 10/19/2015 0858    Radiological Exams on Admission: CT ANGIO HEAD NECK W WO CM  Result Date: 05/15/2022 CLINICAL DATA:  Neuro deficit, acute, stroke suspected. Left facial droop beginning yesterday. EXAM: CT ANGIOGRAPHY HEAD AND NECK TECHNIQUE: Multidetector CT imaging of the head and neck was performed using the standard protocol during bolus administration of intravenous contrast. Multiplanar CT image reconstructions and MIPs were obtained to evaluate the vascular anatomy. Carotid stenosis measurements (when applicable) are obtained utilizing NASCET criteria, using the distal internal carotid diameter as the denominator. RADIATION DOSE REDUCTION: This exam was performed according to the departmental dose-optimization program which includes automated exposure control, adjustment of the mA and/or  kV according to patient size and/or use of iterative reconstruction technique. CONTRAST:  84mL OMNIPAQUE IOHEXOL 350 MG/ML SOLN COMPARISON:  Head CT 05/15/2022.  Chest CT 04/03/2010. FINDINGS: CTA NECK FINDINGS Aortic arch: Dilatation of the included portion of the ascending aorta with a maximal diameter of 4.9 cm, progressed from 2011. Standard 3 vessel aortic arch with mild atherosclerotic plaque. No significant arch vessel origin stenosis. Right carotid system: Patent with a small amount of calcified and soft plaque at the carotid bifurcation and in the proximal ICA. No evidence of a significant stenosis or dissection. Tortuous proximal common carotid artery. Left carotid system: Patent with a small amount of calcified plaque in the proximal ICA. No evidence of a significant stenosis or dissection. Tortuous proximal common carotid artery. Vertebral arteries: Patent with the right being slightly dominant.  No evidence of a dissection or a significant stenosis on the right. Calcified plaque at the left vertebral origin results in moderate to severe stenosis. Soft plaque results in mild left V3 stenosis. Skeleton: Prominent median C1-2 arthropathy. Widespread disc degeneration, most severe at C5-6. Other neck: No evidence of cervical lymphadenopathy or mass. Upper chest: Clear lung apices. Review of the MIP images confirms the above findings CTA HEAD FINDINGS Anterior circulation: The internal carotid arteries are patent from skull base to carotid termini with a small amount of atherosclerotic plaque bilaterally not resulting in significant stenosis. ACAs and MCAs are patent with mild branch vessel irregularity. There is a mild left A1 stenosis, and there is a mild distal right M1 or proximal M2 stenosis. No aneurysm is identified. Posterior circulation: The intracranial vertebral arteries are patent to the basilar with left greater than right V4 segment atherosclerosis resulting in up to mild stenosis. Patent PICA  and SCA origins are seen bilaterally with a suspected severe stenosis noted of the proximal left SCA. Posterior communicating arteries are diminutive or absent. There is a severe proximal right P1 stenosis. There is also a severe stenosis or short segment occlusion involving the P1 and proximal P2 segments of the left PCA over a length of 6 mm, and there are severe left P3 stenoses. No aneurysm is identified. Venous sinuses: Poorly assessed due to arterial contrast timing. Anatomic variants: None. Review of the MIP images confirms the above findings IMPRESSION: 1. Severe stenosis or short segment occlusion of the proximal left PCA. 2. Severe proximal right PCA stenosis. 3. Moderate to severe left vertebral artery origin stenosis. 4. Mild carotid atherosclerosis without significant stenosis. 5. 4.9 cm ascending aortic aneurysm, enlarged from 2011. Recommend semi-annual imaging followup by CTA or MRA and referral to cardiothoracic surgery if not already obtained. This recommendation follows 2010 ACCF/AHA/AATS/ACR/ASA/SCA/SCAI/SIR/STS/SVM Guidelines for the Diagnosis and Management of Patients With Thoracic Aortic Disease. Circulation. 2010; [id number]. Aortic aneurysm NOS (ICD10-I71.9) 6. Aortic Atherosclerosis (ICD10-I70.0). Electronically Signed   By: Logan Bores M.D.   On: 05/15/2022 16:04   CT HEAD WO CONTRAST  Result Date: 05/15/2022 CLINICAL DATA:  Acute neuro deficit. Generalized weakness, slurred speech and facial droop. EXAM: CT HEAD WITHOUT CONTRAST TECHNIQUE: Contiguous axial images were obtained from the base of the skull through the vertex without intravenous contrast. RADIATION DOSE REDUCTION: This exam was performed according to the departmental dose-optimization program which includes automated exposure control, adjustment of the mA and/or kV according to patient size and/or use of iterative reconstruction technique. COMPARISON:  None Available. FINDINGS: Brain: The ventricles are in the  midline without mass effect or shift. They are normal in size and configuration for age and degree of cerebral atrophy. No extra-axial fluid collections are identified. Moderate age related periventricular microvascular ischemic changes. No CT findings for acute hemispheric infarction or intracranial hemorrhage. No mass lesions are identified. Bilateral basal ganglia calcifications are noted. Brainstem and cerebellum are grossly. Mild age related atrophy. Vascular: Vascular calcifications but no aneurysm or hyperdense vessels. Skull: No skull fracture or bone lesion. Mild hyperostosis frontalis interna. Sinuses/Orbits: The paranasal sinuses are clear except for some debris in both halves of the sphenoid sinus. The mastoid air cells and middle ear cavities are clear. The globes are intact. Other: No scalp lesions or scalp hematoma. IMPRESSION: 1. Moderate age related periventricular microvascular ischemic changes. 2. No acute intracranial findings or mass lesions. Electronically Signed   By: Marijo Sanes M.D.   On: 05/15/2022 11:51  MR Brain W and Wo Contrast  Result Date: 05/15/2022 CLINICAL DATA:  Left-sided facial droop EXAM: MRI HEAD WITHOUT AND WITH CONTRAST TECHNIQUE: Multiplanar, multiecho pulse sequences of the brain and surrounding structures were obtained without and with intravenous contrast. CONTRAST:  32mL GADAVIST GADOBUTROL 1 MMOL/ML IV SOLN COMPARISON:  No prior MRI, correlation is made with CT head 05/15/2022 FINDINGS: Brain: Restricted diffusion with ADC correlate in the right external capsule and corona radiata (series 7, images 19-23). This area does not demonstrate contrast enhancement. No acute hemorrhage, mass, mass effect, or midline shift. Confluent T2 hyperintense signal in the periventricular white matter and pons, likely the sequela of moderate to severe chronic small vessel ischemic disease. Overall cerebral volume is within normal limits for age. No hemosiderin deposition to suggest  remote hemorrhage. Vascular: Normal arterial flow voids. Normal vascular enhancement on postcontrast sequences. The venous sinuses are patent. Skull and upper cervical spine: Hyperostosis frontalis. Otherwise normal marrow signal. Sinuses/Orbits: Mild mucosal thickening in the ethmoid air cells. Status post bilateral lens replacements. Other: The mastoids are well aerated. IMPRESSION: Acute or subacute infarcts in the right external capsule and corona radiata. These results were called by telephone at the time of interpretation on 05/15/2022 at 5:48 pm to provider [facility] , who verbally acknowledged these results. Electronically Signed   By: Merilyn Baba M.D.   On: 05/15/2022 17:48    EKG: Independently reviewed. 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 -slurred speech and left sided facial droop over 24 hours at presentation -neuro exam significant of left side facial droop  - CT head negative for acute finding  -MRI noted acute cva  -CTA notes severe proximal right PCA stenosis but not in area of cva  - case discussed with neurology Dr Livia Snellen who recs Esmond Plants asa and continue cva evaluation -admit progressive care  -neuro checks , SLP, PT/OT  -echo per protocol     Ascending Aortic Aneursym  -noted 2011 , now 4.9 larger than before  -will require  semi-annual  CTA/MRA  -as well as CT surgery referral as able   HTN, uncontrolled -permissive HTN  -hold oral medications currently , resume in am  -prn medications per cva protocol   CKDIII -no active issues at baseline    Mild hyponatremia -repeat labs  -serum osmo , urine na   GERD PPI   Depression /anxiety  -resume home regimen as able   B12 Vit D def  -resume supplements as able   DVT prophylaxis: scd  Code Status: full Family Communication: n/a Disposition Plan: patient  expected to be admitted greater than 2 midnights  Consults called: Dr Livia Snellen Neuro Admission  status: progressive care    Clance Boll MD [facility]  If 7PM-7AM, please contact night-coverage [email or web address] Password [facility]  05/15/2022, 6:59 PM

## 2022-05-15 NOTE — ED Notes (Signed)
Patient stated she has some sleep apnea sometimes, patient put on a 3L [place]

## 2022-05-15 NOTE — ED Provider Triage Note (Addendum)
  Emergency Medicine Provider Triage Evaluation Note  Lisa Stokes , a 88 y.o.female,  was evaluated in triage.  Pt complains of facial droop.  She reports left-sided facial droop that started at approximately 1600 yesterday, she states that she was just going about her usual activities.  She states she has had a lot of allergies recently.  Denies any difficulty ambulating or weakness in upper or lower extremities.  No visual disturbances or neglect.   Review of Systems  Positive: Left-sided facial droop,  Negative: Denies fever, chest pain, vomiting  Physical Exam  There were no vitals filed for this visit. Gen:   Awake, no distress   Resp:  Normal effort  MSK:   Moves extremities without difficulty  Other:  Facial droop appreciated on the left side, spares the forehead.  Cranial nerves are otherwise intact.  5/5 strength in upper and lower extremities.  Medical Decision Making  Given the patient's initial medical screening exam, the following diagnostic evaluation has been ordered. The patient will be placed in the appropriate treatment space, once one is available, to complete the evaluation and treatment. I have discussed the plan of care with the patient and I have advised the patient that an ED physician or mid-level practitioner will reevaluate their condition after the test results have been received, as the results may give them additional insight into the type of treatment they may need.    Diagnostics: Labs, head CT  Treatments: none immediately   Teodoro Spray, PA 05/15/22 [place], [place], [place] 05/15/22 1120

## 2022-05-15 NOTE — ED Provider Notes (Signed)
[facility] Provider Note    Event Date/Time   First MD Initiated Contact with Patient 05/15/22 1350     (approximate)   History   Chief Complaint Aphasia   HPI Lisa Stokes is a 88 y.o. female, history of anxiety, hypertension, GERD, depression, presents to the emergency department for evaluation of facial droop.  She states that she noticed the left side of her mouth drooping since 1600 yesterday.  She states otherwise she has felt fine.  She is able to walk on her own and does not have any significant numbness or weakness in any of her extremities.  Denies visual disturbances, hearing changes, headache, chest pain, shortness of breath, dizziness/lightheadedness, or rash/lesions.   History Limitations: No limitations.        Physical Exam  Triage Vital Signs: ED Triage Vitals [05/15/22 1113]  Enc Vitals Group     BP (!) 194/87     Pulse Rate 73     Resp 16     Temp 98.2 F (36.8 C)     Temp Source Oral     SpO2 96 %     Weight      Height      Head Circumference      Peak Flow      Pain Score      Pain Loc      Pain Edu?      Excl. in [place]?     Most recent vital signs: Vitals:   05/15/22 1900 05/15/22 1930  BP: (!) 156/94   Pulse: 78 77  Resp: (!) 22 (!) 23  Temp:    SpO2: 95% 95%    General: Awake, NAD.  Skin: Warm, dry. No rashes or lesions.  Eyes: PERRL. Conjunctivae normal.  CV: Good peripheral perfusion.  Resp: Normal effort.  Abd: Soft, non-tender. No distention.  Neuro: Left-sided facial droop present, particularly along the lip.  Eyebrow/forehead appears to be spared.  EOMI.  No pronator drift.  4/5 strength in all extremities.  Sensation intact in all extremities.   Physical Exam    ED Results / Procedures / Treatments  Labs (all labs ordered are listed, but only abnormal results are displayed) Labs Reviewed  CBC - Abnormal; Notable for the following components:      Result Value   RBC 5.12 (*)     Hemoglobin 15.9 (*)    All other components within normal limits  COMPREHENSIVE METABOLIC PANEL - Abnormal; Notable for the following components:   Sodium 130 (*)    Chloride 97 (*)    CO2 21 (*)    Glucose, Bld 145 (*)    All other components within normal limits  URINALYSIS, ROUTINE W REFLEX MICROSCOPIC - Abnormal; Notable for the following components:   Color, Urine STRAW (*)    APPearance CLEAR (*)    Ketones, ur 5 (*)    All other components within normal limits  PROTIME-INR  APTT  DIFFERENTIAL  SODIUM, URINE, RANDOM  LIPID PANEL  HEMOGLOBIN A1C  OSMOLALITY  CBG MONITORING, ED     EKG Sinus rhythm, rate 77, frequent PVCs present, left axis deviation present, no significant ST segment changes.   RADIOLOGY  ED Provider Interpretation: I personally reviewed and interpreted these images.  Head CT shows no acute hemorrhage.  CT angio head neck shows stenosis in the left and right PCA.  MRI shows acute versus subacute infarcts in the right external capsule.  CT ANGIO HEAD NECK W  WO CM  Result Date: 05/15/2022 CLINICAL DATA:  Neuro deficit, acute, stroke suspected. Left facial droop beginning yesterday. EXAM: CT ANGIOGRAPHY HEAD AND NECK TECHNIQUE: Multidetector CT imaging of the head and neck was performed using the standard protocol during bolus administration of intravenous contrast. Multiplanar CT image reconstructions and MIPs were obtained to evaluate the vascular anatomy. Carotid stenosis measurements (when applicable) are obtained utilizing NASCET criteria, using the distal internal carotid diameter as the denominator. RADIATION DOSE REDUCTION: This exam was performed according to the departmental dose-optimization program which includes automated exposure control, adjustment of the mA and/or kV according to patient size and/or use of iterative reconstruction technique. CONTRAST:  11mL OMNIPAQUE IOHEXOL 350 MG/ML SOLN COMPARISON:  Head CT 05/15/2022.  Chest CT 04/03/2010.  FINDINGS: CTA NECK FINDINGS Aortic arch: Dilatation of the included portion of the ascending aorta with a maximal diameter of 4.9 cm, progressed from 2011. Standard 3 vessel aortic arch with mild atherosclerotic plaque. No significant arch vessel origin stenosis. Right carotid system: Patent with a small amount of calcified and soft plaque at the carotid bifurcation and in the proximal ICA. No evidence of a significant stenosis or dissection. Tortuous proximal common carotid artery. Left carotid system: Patent with a small amount of calcified plaque in the proximal ICA. No evidence of a significant stenosis or dissection. Tortuous proximal common carotid artery. Vertebral arteries: Patent with the right being slightly dominant. No evidence of a dissection or a significant stenosis on the right. Calcified plaque at the left vertebral origin results in moderate to severe stenosis. Soft plaque results in mild left V3 stenosis. Skeleton: Prominent median C1-2 arthropathy. Widespread disc degeneration, most severe at C5-6. Other neck: No evidence of cervical lymphadenopathy or mass. Upper chest: Clear lung apices. Review of the MIP images confirms the above findings CTA HEAD FINDINGS Anterior circulation: The internal carotid arteries are patent from skull base to carotid termini with a small amount of atherosclerotic plaque bilaterally not resulting in significant stenosis. ACAs and MCAs are patent with mild branch vessel irregularity. There is a mild left A1 stenosis, and there is a mild distal right M1 or proximal M2 stenosis. No aneurysm is identified. Posterior circulation: The intracranial vertebral arteries are patent to the basilar with left greater than right V4 segment atherosclerosis resulting in up to mild stenosis. Patent PICA and SCA origins are seen bilaterally with a suspected severe stenosis noted of the proximal left SCA. Posterior communicating arteries are diminutive or absent. There is a severe  proximal right P1 stenosis. There is also a severe stenosis or short segment occlusion involving the P1 and proximal P2 segments of the left PCA over a length of 6 mm, and there are severe left P3 stenoses. No aneurysm is identified. Venous sinuses: Poorly assessed due to arterial contrast timing. Anatomic variants: None. Review of the MIP images confirms the above findings IMPRESSION: 1. Severe stenosis or short segment occlusion of the proximal left PCA. 2. Severe proximal right PCA stenosis. 3. Moderate to severe left vertebral artery origin stenosis. 4. Mild carotid atherosclerosis without significant stenosis. 5. 4.9 cm ascending aortic aneurysm, enlarged from 2011. Recommend semi-annual imaging followup by CTA or MRA and referral to cardiothoracic surgery if not already obtained. This recommendation follows 2010 ACCF/AHA/AATS/ACR/ASA/SCA/SCAI/SIR/STS/SVM Guidelines for the Diagnosis and Management of Patients With Thoracic Aortic Disease. Circulation. 2010; [id number]. Aortic aneurysm NOS (ICD10-I71.9) 6. Aortic Atherosclerosis (ICD10-I70.0). Electronically Signed   By: Logan Bores M.D.   On: 05/15/2022 16:04   CT HEAD  WO CONTRAST  Result Date: 05/15/2022 CLINICAL DATA:  Acute neuro deficit. Generalized weakness, slurred speech and facial droop. EXAM: CT HEAD WITHOUT CONTRAST TECHNIQUE: Contiguous axial images were obtained from the base of the skull through the vertex without intravenous contrast. RADIATION DOSE REDUCTION: This exam was performed according to the departmental dose-optimization program which includes automated exposure control, adjustment of the mA and/or kV according to patient size and/or use of iterative reconstruction technique. COMPARISON:  None Available. FINDINGS: Brain: The ventricles are in the midline without mass effect or shift. They are normal in size and configuration for age and degree of cerebral atrophy. No extra-axial fluid collections are identified. Moderate age  related periventricular microvascular ischemic changes. No CT findings for acute hemispheric infarction or intracranial hemorrhage. No mass lesions are identified. Bilateral basal ganglia calcifications are noted. Brainstem and cerebellum are grossly. Mild age related atrophy. Vascular: Vascular calcifications but no aneurysm or hyperdense vessels. Skull: No skull fracture or bone lesion. Mild hyperostosis frontalis interna. Sinuses/Orbits: The paranasal sinuses are clear except for some debris in both halves of the sphenoid sinus. The mastoid air cells and middle ear cavities are clear. The globes are intact. Other: No scalp lesions or scalp hematoma. IMPRESSION: 1. Moderate age related periventricular microvascular ischemic changes. 2. No acute intracranial findings or mass lesions. Electronically Signed   By: Marijo Sanes M.D.   On: 05/15/2022 11:51   MR Brain W and Wo Contrast  Result Date: 05/15/2022 CLINICAL DATA:  Left-sided facial droop EXAM: MRI HEAD WITHOUT AND WITH CONTRAST TECHNIQUE: Multiplanar, multiecho pulse sequences of the brain and surrounding structures were obtained without and with intravenous contrast. CONTRAST:  16mL GADAVIST GADOBUTROL 1 MMOL/ML IV SOLN COMPARISON:  No prior MRI, correlation is made with CT head 05/15/2022 FINDINGS: Brain: Restricted diffusion with ADC correlate in the right external capsule and corona radiata (series 7, images 19-23). This area does not demonstrate contrast enhancement. No acute hemorrhage, mass, mass effect, or midline shift. Confluent T2 hyperintense signal in the periventricular white matter and pons, likely the sequela of moderate to severe chronic small vessel ischemic disease. Overall cerebral volume is within normal limits for age. No hemosiderin deposition to suggest remote hemorrhage. Vascular: Normal arterial flow voids. Normal vascular enhancement on postcontrast sequences. The venous sinuses are patent. Skull and upper cervical spine:  Hyperostosis frontalis. Otherwise normal marrow signal. Sinuses/Orbits: Mild mucosal thickening in the ethmoid air cells. Status post bilateral lens replacements. Other: The mastoids are well aerated. IMPRESSION: Acute or subacute infarcts in the right external capsule and corona radiata. These results were called by telephone at the time of interpretation on 05/15/2022 at 5:48 pm to provider [facility] , who verbally acknowledged these results. Electronically Signed   By: Merilyn Baba M.D.   On: 05/15/2022 17:48   DG Chest Port 1 View  Result Date: 05/15/2022 CLINICAL DATA:  CTA EXAM: PORTABLE CHEST 1 VIEW COMPARISON:  02/15/2018 FINDINGS: No focal opacity or pleural effusion. Mild cardiomegaly with aortic atherosclerosis. No pneumothorax. Old fracture deformity of the proximal left humerus. IMPRESSION: No active disease.  Mild cardiomegaly Electronically Signed   By: Donavan Foil M.D.   On: 05/15/2022 19:52    PROCEDURES:   .Critical Care Performed by: Teodoro Spray, PA Authorized by: Teodoro Spray, PA   Critical care provider statement:    Critical care time (minutes):  30   Critical care was time spent personally by me on the following activities:  Development of treatment plan with  patient or surrogate, discussions with consultants, evaluation of patient's response to treatment, examination of patient, ordering and review of laboratory studies, ordering and review of radiographic studies, ordering and performing treatments and interventions, pulse oximetry, re-evaluation of patient's condition and review of old charts    [place] ED: Medications  clopidogrel (PLAVIX) tablet 75 mg (has no administration in time range)  PARoxetine (PAXIL) tablet 30 mg (has no administration in time range)   stroke: early stages of recovery book (0 each Does not apply Hold 05/15/22 1935)  acetaminophen (TYLENOL) tablet 650 mg (has no administration in time range)    Or   acetaminophen (TYLENOL) 160 MG/5ML solution 650 mg (has no administration in time range)    Or  acetaminophen (TYLENOL) suppository 650 mg (has no administration in time range)  senna-docusate (Senokot-S) tablet 1 tablet (has no administration in time range)  aspirin chewable tablet 81 mg (has no administration in time range)  iohexol (OMNIPAQUE) 350 MG/ML injection 75 mL (75 mLs Intravenous Contrast Given 05/15/22 1533)  gadobutrol (GADAVIST) 1 MMOL/ML injection 7 mL (7 mLs Intravenous Contrast Given 05/15/22 1726)  aspirin chewable tablet 243 mg (243 mg Oral Given 05/15/22 1830)  clopidogrel (PLAVIX) tablet 75 mg (75 mg Oral Given 05/15/22 1830)     IMPRESSION / MDM / [place] / ED COURSE  I reviewed the triage vital signs and the nursing notes.                              Differential diagnosis includes, but is not limited to, TIA, Bell palsy, CVA, hemorrhagic stroke  ED Course Patient appears well, NAD  CBC shows no leukocytosis or anemia.  CMP shows mild hyponatremia 130, otherwise no evidence of transaminitis or AKI.  Urinalysis shows no evidence of UTI.  Assessment/Plan Patient presents with left-sided facial droop that started at 1600 yesterday.  No other neurological deficits at this time.  She is found on MRI to have acute versus subacute infarcts in the right external capsule and corona radiata.  See above for details regarding CT angio head/neck.  Informed patient of all these findings.  Spoke with on-call neurologist, Dr. Quinn Axe, who stated that she would see the patient in the morning and to go ahead initiate aspirin and Plavix.  Lab work-up is otherwise been reassuring.  We will plan to admit.  Spoke with the on-call hospitalist, Dr. Marcello Moores, who agreed to admission.  Patient's presentation is most consistent with acute presentation with potential threat to life or bodily function.       FINAL CLINICAL IMPRESSION(S) / ED DIAGNOSES   Final diagnoses:  Facial  droop     Rx / DC Orders   ED Discharge Orders     None        Note:  This document was prepared using Dragon voice recognition software and may include unintentional dictation errors.   Teodoro Spray, [place] 05/15/22 Omer Jack, MD 05/20/22 [phone]

## 2022-05-15 NOTE — ED Notes (Signed)
Pt transported to MRI 

## 2022-05-15 NOTE — ED Triage Notes (Signed)
Arrives with daughter, c/o slurred speech, generalized weakness, left facial droop since 1600 yesterday.

## 2022-05-16 ENCOUNTER — Inpatient Hospital Stay (HOSPITAL_BASED_OUTPATIENT_CLINIC_OR_DEPARTMENT_OTHER)
Admit: 2022-05-16 | Discharge: 2022-05-16 | Disposition: A | Discharge status: Home / Self Care | Payer: Medicare Other | Attending: Internal Medicine | Admitting: Internal Medicine

## 2022-05-16 DIAGNOSIS — I6389 Other cerebral infarction: Secondary | ICD-10-CM | POA: Diagnosis not present

## 2022-05-16 DIAGNOSIS — I639 Cerebral infarction, unspecified: Secondary | ICD-10-CM

## 2022-05-16 LAB — ECHOCARDIOGRAM COMPLETE
AR max vel: 2.4 cm2
AV Area VTI: 2.25 cm2
AV Area mean vel: 2.39 cm2
AV Mean grad: 13 mmHg
AV Peak grad: 20.8 mmHg
Ao pk vel: 2.28 m/s
Area-P 1/2: 3.76 cm2
MV VTI: 4.32 cm2
P 1/2 time: 865 msec
S' Lateral: 2.39 cm

## 2022-05-16 LAB — LIPID PANEL
Cholesterol: 209 mg/dL — ABNORMAL HIGH (ref 0–200)
HDL: 54 mg/dL (ref >40)
LDL Cholesterol: 140 mg/dL — ABNORMAL HIGH (ref 0–99)
Total CHOL/HDL Ratio: 3.9 RATIO
Triglycerides: 73 mg/dL (ref <150)
VLDL: 15 mg/dL (ref 0–40)

## 2022-05-16 MED ORDER — CLOPIDOGREL BISULFATE 75 MG PO TABS: 75.0000 mg | ORAL_TABLET | Freq: Every day | ORAL | 11 refills | Status: AC

## 2022-05-16 MED ORDER — PERFLUTREN LIPID MICROSPHERE
1.0000 mL | INTRAVENOUS | Status: AC | PRN
  Administered 2022-05-16: 3 mL via INTRAVENOUS

## 2022-05-16 MED ORDER — ATORVASTATIN CALCIUM 20 MG PO TABS: 80.0000 mg | ORAL_TABLET | Freq: Every day | ORAL | Status: DC

## 2022-05-16 MED ORDER — ATORVASTATIN CALCIUM 80 MG PO TABS: 80.0000 mg | ORAL_TABLET | Freq: Every day | ORAL | 2 refills | Status: AC

## 2022-05-16 MED ORDER — ASPIRIN 81 MG PO CHEW: 81.0000 mg | CHEWABLE_TABLET | Freq: Every day | ORAL | 2 refills | Status: AC

## 2022-05-16 NOTE — TOC Initial Note (Signed)
Transition of Care [facility]) - Initial/Assessment Note    Patient Details  Name: Lisa Stokes MRN: [id number] Date of Birth: 12-08-1934  [facility] CM/SW Contact:    Shelbie Hutching, RN Phone Number: 05/16/2022, 11:56 AM  Clinical Narrative:                 Patient placed under observation for CVA.  RNCM met with patient and patient's daughter at the bedside, introduced self and explained role. Patient is from home and her daughter lives with her.  Patient walks with a rollator at home and does not currently have any home health services.  Patient has been to [facility] in the patient after a hip fracture and then went there for OP therapy.  She has had HH but cannot remember the agency and did not like them.    TOC will follow for needs.  Therapy currently evaluating.   Expected Discharge Plan: Home/Self Care Barriers to Discharge: Continued Medical Work up   Patient Goals and CMS Choice Patient states their goals for this hospitalization and ongoing recovery are:: to get back home      Expected Discharge Plan and Services Expected Discharge Plan: Home/Self Care   Discharge Planning Services: CM Consult   Living arrangements for the past 2 months: Single Family Home                 DME Arranged: N/A DME Agency: NA       HH Arranged: NA HH Agency: NA        Prior Living Arrangements/Services Living arrangements for the past 2 months: Single Family Home Lives with:: Adult Children Patient language and need for interpreter reviewed:: Yes Do you feel safe going back to the place where you live?: Yes      Need for Family Participation in Patient Care: Yes (Comment) Care giver support system in place?: Yes (comment) (daughter) Current home services: DME (rollator) Criminal Activity/Legal Involvement Pertinent to Current Situation/Hospitalization: No - Comment as needed  Activities of Daily Living Home Assistive Devices/Equipment: Environmental consultant (specify  type), Eyeglasses, Dentures (specify type), Hearing aid ADL Screening (condition at time of admission) Patient's cognitive ability adequate to safely complete daily activities?: Yes Is the patient deaf or have difficulty hearing?: No Does the patient have difficulty seeing, even when wearing glasses/contacts?: No Does the patient have difficulty concentrating, remembering, or making decisions?: No Patient able to express need for assistance with ADLs?: Yes Does the patient have difficulty dressing or bathing?: No Independently performs ADLs?: Yes (appropriate for developmental age) Does the patient have difficulty walking or climbing stairs?: Yes Weakness of Legs: Left Weakness of Arms/Hands: None  Permission Sought/Granted Permission sought to share information with : Family Supports Permission granted to share information with : Yes, Verbal Permission Granted  Share Information with NAME: Annamaria Helling     Permission granted to share info w Relationship: daughter  Permission granted to share info w Contact Information: [phone]  Emotional Assessment Appearance:: Appears stated age Attitude/Demeanor/Rapport: Engaged Affect (typically observed): Accepting Orientation: : Oriented to Self, Oriented to Place, Oriented to  Time, Oriented to Situation Alcohol / Substance Use: Not Applicable Psych Involvement: No (comment)  Admission diagnosis:  Acute CVA (cerebrovascular accident) [facility]) [I63.9] Patient Active Problem List   Diagnosis Date Noted   Acute CVA (cerebrovascular accident) ([place]) 05/15/2022   Breakdown (mechanical) of internal fixation device of left femur, initial encounter ([place]) 04/01/2018   Hip fracture ([place]) 02/15/2018   PCP:  Valera Castle, MD Pharmacy:   [place], [place], [place] [address] Phone: [phone] Fax: [place], [place] - [address] [place] [address] Phone: [phone] Fax: [place], [place] - [place] [address] [facility] [place] [address] Phone: [phone] Fax: [phone]     Social Determinants of Health (SDOH) Interventions    Readmission Risk Interventions     View : No data to display.

## 2022-05-16 NOTE — ED Notes (Signed)
Pt appears to be sleeping, even RR and unlabored, NAD noted, call bell in reach, side rails up x2 for safety, care on going, will continue to monitor. 

## 2022-05-16 NOTE — Care Management CC44 (Signed)
Condition Code 44 Documentation Completed  Patient Details  Name: Lisa Stokes MRN: [id number] Date of Birth: 1934/08/17   Condition Code 44 given:  Yes Patient signature on Condition Code 44 notice:  Yes Documentation of 2 MD's agreement:  Yes Code 44 added to claim:  Yes    Allayne Butcher, RN 05/16/2022, 3:47 PM

## 2022-05-16 NOTE — ED Notes (Signed)
PT resting in bed, visitor present. VSS.

## 2022-05-16 NOTE — Evaluation (Signed)
Occupational Therapy Evaluation Patient Details Name: Lisa Stokes MRN: [id number] DOB: 1934/02/05 Today's Date: 05/16/2022   History of Present Illness Lisa Stokes is a 88 y.o. 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 Pt daughter present throughout. Pt reports she lives with her daughter and has assist 24/7 if needed. Pt reports she feels close to baseline however does feel generally weak. Pt presents with deficits in activity tolerance and balance, affecting safe and optimal ADL completion. LUE with baseline impairments due to old L shoulder injury, ?L sided inattention, OT will continue to assess. Recommend discharge with HHOT. OT will follow acutely.      Recommendations for follow up therapy are one component of a multi-disciplinary discharge planning process, led by the attending physician.  Recommendations may be updated based on patient status, additional functional criteria and insurance authorization.   Follow Up Recommendations  Home health OT    Assistance Recommended at Discharge Intermittent Supervision/Assistance  Patient can return home with the following A little help with walking and/or transfers;A little help with bathing/dressing/bathroom    Functional Status Assessment  Patient has had a recent decline in their functional status and demonstrates the ability to make significant improvements in function in a reasonable and predictable amount of time.  Equipment Recommendations  None recommended by OT    Recommendations for Other Services       Precautions  / Restrictions Precautions Precautions: Fall Restrictions Weight Bearing Restrictions: No      Mobility Bed Mobility Overal bed mobility: Needs Assistance Bed Mobility: Supine to Sit, Sit to Supine     Supine to sit: Min assist, HOB elevated Sit to supine: Min assist, HOB elevated        Transfers   Equipment used: Rolling walker (2 wheels) Transfers: Sit to/from Stand Sit to Stand: Min guard                  Balance Overall balance assessment: Needs assistance, History of Falls Sitting-balance support: Feet supported Sitting balance-Leahy Scale: Fair Sitting balance - Comments: F dynamic sitting balance during LB dressing   Standing balance support: Bilateral upper extremity supported, During functional activity, Reliant on assistive device for balance Standing balance-Leahy Scale: Fair                             ADL either performed or assessed with clinical judgement   ADL Overall ADL's : Needs assistance/impaired Eating/Feeding: Sitting;Set up   Grooming: Wash/dry hands;Sitting;Set up               Lower Body Dressing: Minimal assistance Lower Body Dressing Details (indicate cue type and reason): socks at edge of bed/ donn/doff Toilet Transfer: Supervision/safety;Ambulation Toilet Transfer Details (indicate cue type and reason): simulated         Functional mobility during ADLs: Supervision/safety;Min guard;Rolling walker (2 wheels) (community distances)       Vision Baseline Vision/History: 1 Wears glasses Patient Visual Report: No change from baseline Additional Comments: no change from baseline per pt report, no c/o blurriness/double vision; pt does hit objects with  RW on 3 occasions on L side, daugther reports she has had difficulties with RW in the past; OT will continue to assess L sided inattention.     Perception Perception Perception: Impaired Inattention/Neglect: Impaired-to be further tested in functional context    Praxis Praxis Praxis: Intact    Pertinent Vitals/Pain Pain Assessment Pain Assessment: No/denies pain     Hand Dominance Right   Extremity/Trunk Assessment Upper Extremity Assessment Upper Extremity Assessment: LUE deficits/detail;Overall WFL for tasks assessed LUE Deficits / Details: Pt with baseline shoulder impairments; unable to AROM flex above approx 60 degrees she reports this is baseline and does not feel different after acute diagnosis; elbow, wrist, grip strength WFL   Lower Extremity Assessment Lower Extremity Assessment: Overall WFL for tasks assessed   Cervical / Trunk Assessment Cervical / Trunk Assessment: Normal   Communication Communication Communication: No difficulties   Cognition Arousal/Alertness: Awake/alert Behavior During Therapy: WFL for tasks assessed/performed Overall Cognitive Status: Within Functional Limits for tasks assessed                                 General Comments: pt is alert and oriented x4, daugther reports pt appears close to baseline for cognition     General Comments  spo2 WNL throughout    Exercises Other Exercises Other Exercises: edu re: role of OT, role of rehab, discharge recommendations. home safety   Shoulder Instructions      Home Living Family/patient expects to be discharged to:: Private residence Living Arrangements: Children Available Help at Discharge: Family;Available 24 hours/day Type of Home: House Home Access: Stairs to enter [employer] of Steps: 6 Entrance Stairs-Rails: Left Home Layout: One level     Bathroom Shower/Tub: Producer, television/film/video: Standard     Home Equipment: Rollator (4 wheels);Grab bars - tub/shower;Shower seat;Grab bars - toilet          Prior Functioning/Environment Prior Level of Function : Independent/Modified Independent             Mobility Comments: amb with rollator, no recent falls ADLs Comments: pt MOD I with dressing,  assist with bathing; daugther assists with all IADLs        OT Problem List: Decreased strength;Decreased activity tolerance      OT Treatment/Interventions: Self-care/ADL training;DME and/or AE instruction;Therapeutic activities;Therapeutic exercise;Balance training;Patient/family education    OT Goals(Current goals can be found in the care plan section) Acute Rehab OT Goals Patient Stated Goal: go home OT Goal Formulation: With patient/family Time For Goal Achievement: 05/30/22 Potential to Achieve Goals: Good ADL Goals Pt Will Perform Grooming: with supervision;standing;sitting Pt Will Perform Upper Body Dressing: with supervision;sitting Pt Will Perform Lower Body Dressing: with supervision;sit to/from stand Pt Will Transfer to Toilet: with supervision;ambulating Pt Will Perform Toileting - Clothing Manipulation and hygiene: with supervision  OT Frequency: Min 2X/week    Co-evaluation PT/OT/SLP Co-Evaluation/Treatment: Yes Reason for Co-Treatment: For patient/therapist safety;To address functional/ADL transfers   OT goals addressed during session: ADL's and self-care      AM-PAC OT "6 Clicks" Daily Activity     Outcome Measure Help from another person eating meals?: None Help from another person taking care of personal grooming?: None Help from another person toileting, which includes using toliet, bedpan, or urinal?: A Little Help from another person bathing (including washing, rinsing, drying)?: A Little Help from another person to put on and taking off regular upper body clothing?: None Help  from another person to put on and taking off regular lower body clothing?: A Little 6 Click Score: 21   End of Session Equipment Utilized During Treatment: Rolling walker (2 wheels)  Activity Tolerance: Patient tolerated treatment well Patient left: in bed;with call bell/phone within reach;with bed alarm set;with family/visitor present  OT Visit Diagnosis: Unsteadiness on feet  (R26.81)                Time: [id number] OT Time Calculation (min): 18 min Charges:  OT General Charges $OT Visit: 1 Visit OT Evaluation $OT Eval Low Complexity: 1 Low  Oleta Mousenna Lisa Stokes, OTD OTR/L  05/16/22, 1:41 PM

## 2022-05-16 NOTE — Evaluation (Signed)
Physical Therapy Evaluation Patient Details Name: Lisa Stokes MRN: [id number] DOB: June 08, 1934 Today's Date: 05/16/2022  History of Present Illness  CHARIE PINKUS is a 88 y.o. 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   Clinical Impression  Patient received in bed, daughter present in room. Patient agreeable to PT assessment. Patient requires min A for bed mobility, transfers with min guard. Ambulated with min guard and RW. She feels weaker but will be appropriate to return home with family assist and HHPT.        Recommendations for follow up therapy are one component of a multi-disciplinary discharge planning process, led by the attending physician.  Recommendations may be updated based on patient status, additional functional criteria and insurance authorization.  Follow Up Recommendations Home health PT    Assistance Recommended at Discharge Intermittent Supervision/Assistance  Patient can return home with the following  A little help with walking and/or transfers;A little help with bathing/dressing/bathroom;Help with stairs or ramp for entrance;Assist for transportation;Assistance with cooking/housework    Equipment Recommendations None recommended by PT  Recommendations for Other Services       Functional Status Assessment Patient has had a recent decline in their functional status and demonstrates the ability to make significant improvements in function in a reasonable and predictable amount of time.     Precautions / Restrictions Precautions Precautions: Fall Restrictions Weight Bearing Restrictions: No      Mobility  Bed Mobility Overal bed  mobility: Needs Assistance Bed Mobility: Supine to Sit, Sit to Supine     Supine to sit: Min assist Sit to supine: Min assist        Transfers Overall transfer level: Needs assistance Equipment used: Rolling walker (2 wheels) Transfers: Sit to/from Stand Sit to Stand: Min guard                Ambulation/Gait Ambulation/Gait assistance: Min guard Gait Distance (Feet): 200 Feet Assistive device: Rolling walker (2 wheels) Gait Pattern/deviations: Step-through pattern, Decreased step length - right, Decreased step length - left Gait velocity: slightly decr     General Gait Details: patient without LOB, reports feeling weaker than usual Ambulated on room air with saturations > 92% throughout session. Patient left on room air.  Stairs            Wheelchair Mobility    Modified Rankin (Stroke Patients Only)       Balance Overall balance assessment: Needs assistance, History of Falls Sitting-balance support: Feet supported Sitting balance-Leahy Scale: Fair Sitting balance - Comments: losing balance posteriorly when donning socks in sitting edge of bed   Standing balance support: Bilateral upper extremity supported, During functional activity, Reliant on assistive device for balance Standing balance-Leahy Scale: Fair                               Pertinent Vitals/Pain Pain Assessment Pain Assessment: No/denies pain    Home Living Family/patient expects to be discharged to:: Private residence Living Arrangements: Children Available Help at Discharge: Family;Available 24 hours/day Type of Home: House Home Access: Stairs to enter Entrance Stairs-Rails: Left Entrance Stairs-Number of Steps: 6   Home Layout: One level Home Equipment:  Rollator (4 wheels);Grab bars - tub/shower;Shower seat      Prior Function Prior Level of Function : Independent/Modified Independent             Mobility Comments: requires rollator for mobility no recent  falls ADLs Comments: requires some assist with bathing, daughter is able to help as needed     Hand Dominance   Dominant Hand: Right    Extremity/Trunk Assessment   Upper Extremity Assessment Upper Extremity Assessment: Defer to OT evaluation    Lower Extremity Assessment Lower Extremity Assessment: Overall WFL for tasks assessed    Cervical / Trunk Assessment Cervical / Trunk Assessment: Normal  Communication   Communication: No difficulties  Cognition Arousal/Alertness: Awake/alert Behavior During Therapy: WFL for tasks assessed/performed Overall Cognitive Status: Within Functional Limits for tasks assessed                                          General Comments      Exercises     Assessment/Plan    PT Assessment Patient needs continued PT services  PT Problem List Decreased strength;Decreased mobility;Decreased activity tolerance       PT Treatment Interventions DME instruction;Therapeutic exercise;Gait training;Balance training;Functional mobility training;Therapeutic activities;Patient/family education;Stair training    PT Goals (Current goals can be found in the Care Plan section)  Acute Rehab PT Goals Patient Stated Goal: to return home with HHPT PT Goal Formulation: With patient/family Time For Goal Achievement: 05/30/22 Potential to Achieve Goals: Good    Frequency Min 2X/week     Co-evaluation               AM-PAC PT "6 Clicks" Mobility  Outcome Measure Help needed turning from your back to your side while in a flat bed without using bedrails?: A Little Help needed moving from lying on your back to sitting on the side of a flat bed without using bedrails?: A Little Help needed moving to and from a bed to a chair (including a wheelchair)?: A Little Help needed standing up from a chair using your arms (e.g., wheelchair or bedside chair)?: A Little Help needed to walk in hospital room?: A Little Help needed climbing 3-5  steps with a railing? : A Little 6 Click Score: 18    End of Session Equipment Utilized During Treatment: Gait belt Activity Tolerance: Patient tolerated treatment well Patient left: in bed;with call bell/phone within reach;with family/visitor present Nurse Communication: Mobility status PT Visit Diagnosis: Muscle weakness (generalized) (M62.81);Unsteadiness on feet (R26.81)    Time: 3664-4034 PT Time Calculation (min) (ACUTE ONLY): 18 min   Charges:   PT Evaluation $PT Eval Moderate Complexity: 1 Mod          Willie Loy, PT, GCS 05/16/22,12:38 PM

## 2022-05-16 NOTE — Discharge Summary (Signed)
Physician Discharge Summary   Lisa Footnne N Handel  female DOB: 06/04/1934  ZOX:[id number]  PCP: Dione Housekeeperlmedo, Mario Ernesto, MD  Admit date: 05/15/2022 Discharge date: 05/16/2022  Admitted From: home Disposition:  home Daughter updated at bedside prior to discharge. Home Health: Yes CODE STATUS: DNR  Discharge Instructions     Ambulatory referral to Cardiothoracic Surgery   Complete by: As directed    semiannual surveillance of her 4.9cm ascending aortic aneurysm   Ambulatory referral to Neurology   Complete by: As directed    Diet - low sodium heart healthy   Complete by: As directed    Discharge instructions   Complete by: As directed    To help with stroke prevention, Neurology recommends taking aspirin 81 mg and Plavix 75 mg daily together for 90 days, and then after that, just Plavix 75 mg daily alone.  You are also started on Lipitor 80 mg daily.  You have a 4.9 cm ascending aortic aneurysm.  I have made a referral for you to follow up with cardiothoracic surgery for monitoring.   Dr. Darlin Priestlyina Zionna [facility]- -      Hospital Course:  For full details, please see H&P, progress notes, consult notes and ancillary notes.  Briefly,  Lisa Stokes is a 88 y.o. 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   Acute CVA right external capsule and corona radiata. -MRI noted acute cva  -CTA showed Severe stenosis or short segment occlusion of the proximal left PCA, Severe proximal right PCA stenosis, Moderate to severe left vertebral artery origin stenosis. --TTE showed no intracardiac thrombus or shunt. --neuro rec ASA 81mg  daily + plavix 75mg  daily x90 days f/b plavix 75mg  daily monotherapy after that (patient was on ASA PTA; will do DAPT for 90 days instead of 21 bc  patient has severe intracranial stenosis). - Atorvastatin 80mg  daily - outpatient neuro f/u   Ascending Aortic Aneursym  -noted 2011, now 4.9 cm -will require semi-annual  CTA/MRA  -CT surgery referral made, but pt can also follow up with PCP for monitoring   HTN, uncontrolled -permissive HTN  --home atenolol and Lisinopril resumed after discharge   CKDIII --stable   Mild hyponatremia --of unclear clinically significance   Depression/anxiety  --cont home Paxil    Discharge Diagnoses:  Principal Problem:   Acute CVA (cerebrovascular accident) (HCC)   30 Day Unplanned Readmission Risk Score    Flowsheet Row ED [facility]  30 Day Unplanned Readmission Risk Score (%) 7.49 Filed at 05/16/2022 1200       This score is the patient's risk of an unplanned readmission within 30 days of being discharged (0 -100%). The score is based on dignosis, age, lab data, medications, orders, and past utilization.   Low:  0-14.9   Medium: 15-21.9   High: 22-29.9   Extreme: 30 and above         Discharge Instructions:  Allergies as of 05/16/2022       Reactions   Cephalexin Diarrhea   Augmentin [amoxicillin-pot Clavulanate] Diarrhea   Has patient had a PCN reaction causing immediate rash, facial/tongue/throat swelling, SOB or lightheadedness with hypotension: No Has patient had a PCN reaction causing severe rash involving mucus membranes or skin necrosis: No Has patient had a  PCN reaction that required hospitalization: No Has patient had a PCN reaction occurring within the last 10 years: No If all of the above answers are "NO", then may proceed with Cephalosporin use.   Clindamycin/lincomycin Other (See Comments)   Reaction: unknown   Demerol [meperidine] Other (See Comments)   Reaction: dizziness   Percocet [oxycodone-acetaminophen] Nausea Only, Other (See Comments)   Reaction: unknown        Medication List      TAKE these medications    aspirin 81 MG chewable tablet Chew 1 tablet (81 mg total) by mouth daily. Start taking on: May 17, 2022   atenolol 50 MG tablet Commonly known as: TENORMIN Take 50 mg by mouth daily.   atorvastatin 80 MG tablet Commonly known as: LIPITOR Take 1 tablet (80 mg total) by mouth daily.   clopidogrel 75 MG tablet Commonly known as: PLAVIX Take 1 tablet (75 mg total) by mouth daily. Start taking on: May 17, 2022   lisinopril 20 MG tablet Commonly known as: ZESTRIL Take 20 mg by mouth daily.   PARoxetine 20 MG tablet Commonly known as: PAXIL Take 30 mg by mouth daily.         Follow-up Information     Zada Finders Joycie Peek, MD Follow up in 1 week(s).   Specialty: Family Medicine Contact information: [address] [place] [address] [phone]                 Allergies  Allergen Reactions   Cephalexin Diarrhea   Augmentin [Amoxicillin-Pot Clavulanate] Diarrhea    Has patient had a PCN reaction causing immediate rash, facial/tongue/throat swelling, SOB or lightheadedness with hypotension: No Has patient had a PCN reaction causing severe rash involving mucus membranes or skin necrosis: No Has patient had a PCN reaction that required hospitalization: No Has patient had a PCN reaction occurring within the last 10 years: No If all of the above answers are "NO", then may proceed with Cephalosporin use.    Clindamycin/Lincomycin Other (See Comments)    Reaction: unknown   Demerol [Meperidine] Other (See Comments)    Reaction: dizziness   Percocet [Oxycodone-Acetaminophen] Nausea Only and Other (See Comments)    Reaction: unknown     The results of significant diagnostics from this hospitalization (including imaging, microbiology, ancillary and laboratory) are listed below for reference.   Consultations:   Procedures/Studies: CT ANGIO HEAD NECK W WO CM  Result Date: 05/15/2022 CLINICAL DATA:  Neuro deficit, acute, stroke  suspected. Left facial droop beginning yesterday. EXAM: CT ANGIOGRAPHY HEAD AND NECK TECHNIQUE: Multidetector CT imaging of the head and neck was performed using the standard protocol during bolus administration of intravenous contrast. Multiplanar CT image reconstructions and MIPs were obtained to evaluate the vascular anatomy. Carotid stenosis measurements (when applicable) are obtained utilizing NASCET criteria, using the distal internal carotid diameter as the denominator. RADIATION DOSE REDUCTION: This exam was performed according to the departmental dose-optimization program which includes automated exposure control, adjustment of the mA and/or kV according to patient size and/or use of iterative reconstruction technique. CONTRAST:  32mL OMNIPAQUE IOHEXOL 350 MG/ML SOLN COMPARISON:  Head CT 05/15/2022.  Chest CT 04/03/2010. FINDINGS: CTA NECK FINDINGS Aortic arch: Dilatation of the included portion of the ascending aorta with a maximal diameter of 4.9 cm, progressed from 2011. Standard 3 vessel aortic arch with mild atherosclerotic plaque. No significant arch vessel origin stenosis. Right carotid system: Patent with a small amount of calcified and soft plaque at the carotid bifurcation  and in the proximal ICA. No evidence of a significant stenosis or dissection. Tortuous proximal common carotid artery. Left carotid system: Patent with a small amount of calcified plaque in the proximal ICA. No evidence of a significant stenosis or dissection. Tortuous proximal common carotid artery. Vertebral arteries: Patent with the right being slightly dominant. No evidence of a dissection or a significant stenosis on the right. Calcified plaque at the left vertebral origin results in moderate to severe stenosis. Soft plaque results in mild left V3 stenosis. Skeleton: Prominent median C1-2 arthropathy. Widespread disc degeneration, most severe at C5-6. Other neck: No evidence of cervical lymphadenopathy or mass. Upper chest:  Clear lung apices. Review of the MIP images confirms the above findings CTA HEAD FINDINGS Anterior circulation: The internal carotid arteries are patent from skull base to carotid termini with a small amount of atherosclerotic plaque bilaterally not resulting in significant stenosis. ACAs and MCAs are patent with mild branch vessel irregularity. There is a mild left A1 stenosis, and there is a mild distal right M1 or proximal M2 stenosis. No aneurysm is identified. Posterior circulation: The intracranial vertebral arteries are patent to the basilar with left greater than right V4 segment atherosclerosis resulting in up to mild stenosis. Patent PICA and SCA origins are seen bilaterally with a suspected severe stenosis noted of the proximal left SCA. Posterior communicating arteries are diminutive or absent. There is a severe proximal right P1 stenosis. There is also a severe stenosis or short segment occlusion involving the P1 and proximal P2 segments of the left PCA over a length of 6 mm, and there are severe left P3 stenoses. No aneurysm is identified. Venous sinuses: Poorly assessed due to arterial contrast timing. Anatomic variants: None. Review of the MIP images confirms the above findings IMPRESSION: 1. Severe stenosis or short segment occlusion of the proximal left PCA. 2. Severe proximal right PCA stenosis. 3. Moderate to severe left vertebral artery origin stenosis. 4. Mild carotid atherosclerosis without significant stenosis. 5. 4.9 cm ascending aortic aneurysm, enlarged from 2011. Recommend semi-annual imaging followup by CTA or MRA and referral to cardiothoracic surgery if not already obtained. This recommendation follows 2010 ACCF/AHA/AATS/ACR/ASA/SCA/SCAI/SIR/STS/SVM Guidelines for the Diagnosis and Management of Patients With Thoracic Aortic Disease. Circulation. 2010; 121: Z610-R604. Aortic aneurysm NOS (ICD10-I71.9) 6. Aortic Atherosclerosis (ICD10-I70.0). Electronically Signed   By: Sebastian Ache  M.D.   On: 05/15/2022 16:04   CT HEAD WO CONTRAST  Result Date: 05/15/2022 CLINICAL DATA:  Acute neuro deficit. Generalized weakness, slurred speech and facial droop. EXAM: CT HEAD WITHOUT CONTRAST TECHNIQUE: Contiguous axial images were obtained from the base of the skull through the vertex without intravenous contrast. RADIATION DOSE REDUCTION: This exam was performed according to the departmental dose-optimization program which includes automated exposure control, adjustment of the mA and/or kV according to patient size and/or use of iterative reconstruction technique. COMPARISON:  None Available. FINDINGS: Brain: The ventricles are in the midline without mass effect or shift. They are normal in size and configuration for age and degree of cerebral atrophy. No extra-axial fluid collections are identified. Moderate age related periventricular microvascular ischemic changes. No CT findings for acute hemispheric infarction or intracranial hemorrhage. No mass lesions are identified. Bilateral basal ganglia calcifications are noted. Brainstem and cerebellum are grossly. Mild age related atrophy. Vascular: Vascular calcifications but no aneurysm or hyperdense vessels. Skull: No skull fracture or bone lesion. Mild hyperostosis frontalis interna. Sinuses/Orbits: The paranasal sinuses are clear except for some debris in both halves of the sphenoid  sinus. The mastoid air cells and middle ear cavities are clear. The globes are intact. Other: No scalp lesions or scalp hematoma. IMPRESSION: 1. Moderate age related periventricular microvascular ischemic changes. 2. No acute intracranial findings or mass lesions. Electronically Signed   By: Rudie Meyer M.D.   On: 05/15/2022 11:51   MR Brain W and Wo Contrast  Result Date: 05/15/2022 CLINICAL DATA:  Left-sided facial droop EXAM: MRI HEAD WITHOUT AND WITH CONTRAST TECHNIQUE: Multiplanar, multiecho pulse sequences of the brain and surrounding structures were obtained  without and with intravenous contrast. CONTRAST:  7mL GADAVIST GADOBUTROL 1 MMOL/ML IV SOLN COMPARISON:  No prior MRI, correlation is made with CT head 05/15/2022 FINDINGS: Brain: Restricted diffusion with ADC correlate in the right external capsule and corona radiata (series 7, images 19-23). This area does not demonstrate contrast enhancement. No acute hemorrhage, mass, mass effect, or midline shift. Confluent T2 hyperintense signal in the periventricular white matter and pons, likely the sequela of moderate to severe chronic small vessel ischemic disease. Overall cerebral volume is within normal limits for age. No hemosiderin deposition to suggest remote hemorrhage. Vascular: Normal arterial flow voids. Normal vascular enhancement on postcontrast sequences. The venous sinuses are patent. Skull and upper cervical spine: Hyperostosis frontalis. Otherwise normal marrow signal. Sinuses/Orbits: Mild mucosal thickening in the ethmoid air cells. Status post bilateral lens replacements. Other: The mastoids are well aerated. IMPRESSION: Acute or subacute infarcts in the right external capsule and corona radiata. These results were called by telephone at the time of interpretation on 05/15/2022 at 5:48 pm to provider [facility] , who verbally acknowledged these results. Electronically Signed   By: Wiliam Ke M.D.   On: 05/15/2022 17:48   DG Chest Port 1 View  Result Date: 05/15/2022 CLINICAL DATA:  CTA EXAM: PORTABLE CHEST 1 VIEW COMPARISON:  02/15/2018 FINDINGS: No focal opacity or pleural effusion. Mild cardiomegaly with aortic atherosclerosis. No pneumothorax. Old fracture deformity of the proximal left humerus. IMPRESSION: No active disease.  Mild cardiomegaly Electronically Signed   By: Jasmine Pang M.D.   On: 05/15/2022 19:52      Labs: BNP (last 3 results) No results for input(s): BNP in the last 8760 hours. Basic Metabolic Panel: Recent Labs  Lab 05/15/22 1116  NA 130*  K 4.3  CL 97*  CO2  21*  GLUCOSE 145*  BUN 20  CREATININE 0.82  CALCIUM 9.3   Liver Function Tests: Recent Labs  Lab 05/15/22 1116  AST 21  ALT 13  ALKPHOS 55  BILITOT 0.9  PROT 7.6  ALBUMIN 4.4   No results for input(s): LIPASE, AMYLASE in the last 168 hours. No results for input(s): AMMONIA in the last 168 hours. CBC: Recent Labs  Lab 05/15/22 1116  WBC 8.0  NEUTROABS 6.8  HGB 15.9*  HCT 45.2  MCV 88.3  PLT 225   Cardiac Enzymes: No results for input(s): CKTOTAL, CKMB, CKMBINDEX, TROPONINI in the last 168 hours. BNP: Invalid input(s): POCBNP CBG: No results for input(s): GLUCAP in the last 168 hours. D-Dimer No results for input(s): DDIMER in the last 72 hours. Hgb A1c Recent Labs    05/15/22 1932  HGBA1C 5.5   Lipid Profile Recent Labs    05/16/22 0548  CHOL 209*  HDL 54  LDLCALC 140*  TRIG 73  CHOLHDL 3.9   Thyroid function studies No results for input(s): TSH, T4TOTAL, T3FREE, THYROIDAB in the last 72 hours.  Invalid input(s): FREET3 Anemia work up No results for input(s): VITAMINB12,  FOLATE, FERRITIN, TIBC, IRON, RETICCTPCT in the last 72 hours. Urinalysis    Component Value Date/Time   COLORURINE STRAW (A) 05/15/2022 1932   APPEARANCEUR CLEAR (A) 05/15/2022 1932   APPEARANCEUR Clear 10/19/2015 0858   LABSPEC 1.023 05/15/2022 1932   PHURINE 6.0 05/15/2022 1932   GLUCOSEU NEGATIVE 05/15/2022 1932   HGBUR NEGATIVE 05/15/2022 1932   BILIRUBINUR NEGATIVE 05/15/2022 1932   BILIRUBINUR Negative 10/19/2015 0858   KETONESUR 5 (A) 05/15/2022 1932   PROTEINUR NEGATIVE 05/15/2022 1932   NITRITE NEGATIVE 05/15/2022 1932   LEUKOCYTESUR NEGATIVE 05/15/2022 1932   Sepsis Labs Invalid input(s): PROCALCITONIN,  WBC,  LACTICIDVEN Microbiology No results found for this or any previous visit (from the past 240 hour(s)).   Total time spend on discharging this patient, including the last patient exam, discussing the hospital stay, instructions for ongoing care as it  relates to all pertinent caregivers, as well as preparing the medical discharge records, prescriptions, and/or referrals as applicable, is 50 minutes.    Darlin Priestly, MD  [facility] 05/16/2022, 3:05 PM

## 2022-05-16 NOTE — ED Notes (Signed)
Pt appears to be resting, even RR and unlabored, NAD noted, call bell in reach, side rails up x2 for safety, care on going, will continue to monitor.  

## 2022-05-16 NOTE — ED Notes (Signed)
Pt continues to sleep, even RR and unlabored, NAD noted, call bell in reach, side rails up x2 for safety, care on going, will continue to monitor.  

## 2022-05-16 NOTE — ED Notes (Signed)
DNR bractlet placed on pt's RIGHT wrist

## 2022-05-16 NOTE — TOC Transition Note (Signed)
Transition of Care [facility]) - CM/SW Discharge Note   Patient Details  Name: Lisa Stokes MRN: [id number] Date of Birth: Oct 31, 1934  Transition of Care [facility]) CM/SW Contact:  Shelbie Hutching, RN Phone Number: 05/16/2022, 4:17 PM   Clinical Narrative:    Patient ready for discharge home.  PT and OT have recommended home health services, patient and daughter agree, They choose [facility] for [facility] health.  Corene Cornea with [facility] has accepted referral.  Daughter to transport patient home.   Final next level of care: [place] Barriers to Discharge: Barriers Resolved   Patient Goals and CMS Choice Patient states their goals for this hospitalization and ongoing recovery are:: to get back home CMS Medicare.gov Compare Post Acute Care list provided to:: Patient Represenative (must comment) Choice offered to / list presented to : Patient, Adult Children  Discharge Placement                       Discharge Plan and Services   Discharge Planning Services: CM Consult            DME Arranged: N/A DME Agency: NA       HH Arranged: PT, OT [place] Agency: [place] ([facility]) Date [place]: 05/16/22 Time [place]: [place] Representative spoke with at [place]: Corene Cornea  Social Determinants of Health ([place]) Interventions     Readmission Risk Interventions     View : No data to display.

## 2022-05-16 NOTE — Care Management Obs Status (Signed)
[place] NOTIFICATION   Patient Details  Name: Lisa Stokes MRN: [id number] Date of Birth: 1934/02/10   Medicare Observation Status Notification Given:  Yes    Shelbie Hutching, RN 05/16/2022, 11:44 AM

## 2022-05-16 NOTE — Care Management Obs Status (Signed)
[place] NOTIFICATION   Patient Details  Name: Lisa Stokes MRN: [id number] Date of Birth: 05-02-1934   Medicare Observation Status Notification Given:  Yes    Shelbie Hutching, RN 05/16/2022, 3:48 PM

## 2022-05-16 NOTE — Evaluation (Signed)
Speech Language Pathology Evaluation Patient Details Name: Lisa Stokes MRN: [id number] DOB: 11-Jan-1934 Today's Date: 05/16/2022 Time: 1130-1200 SLP Time Calculation (min) (ACUTE ONLY): 30 min  Problem List:  Patient Active Problem List   Diagnosis Date Noted   Acute CVA (cerebrovascular accident) ([place]) 05/15/2022   Breakdown (mechanical) of internal fixation device of left femur, initial encounter ([place]) 04/01/2018   Hip fracture ([place]) 02/15/2018   Past Medical History:  Past Medical History:  Diagnosis Date   Anxiety    Arthritis    Complication of anesthesia    AGE 60 AFTER DOUBLE MASTECTOMIES SEIZURE ACTIVITY IN THIGH. NO PROBLEM SINCE   CRI (chronic renal insufficiency)    STAGE 3   Depression    Eczema    GERD (gastroesophageal reflux disease)    Heart murmur    HTN (hypertension)    Sclerosing adenosis of breast    Past Surgical History:  Past Surgical History:  Procedure Laterality Date   HARDWARE REMOVAL Left 04/01/2018   Procedure: HARDWARE REMOVAL-LEFT HIP SYNTHES NAIL;  Surgeon: Lovell Sheehan, MD;  Location: [facility] ORS;  Service: Orthopedics;  Laterality: Left;   HEMORRHOID SURGERY     external   INTRAMEDULLARY (IM) NAIL INTERTROCHANTERIC Left 02/16/2018   Procedure: INTRAMEDULLARY (IM) NAIL INTERTROCHANTRIC;  Surgeon: Earnestine Leys, MD;  Location: [facility] ORS;  Service: Orthopedics;  Laterality: Left;   JOINT REPLACEMENT     MASTECTOMY Bilateral    FOR SCLEROSING ADENOSIS   TOTAL HIP ARTHROPLASTY Left 04/01/2018   Procedure: TOTAL HIP ARTHROPLASTY- POSTERIOR;  Surgeon: Lovell Sheehan, MD;  Location: [facility] ORS;  Service: Orthopedics;  Laterality: Left;   HPI:  88yo female admitted 05/15/22 with slurred speech, generalized weakness, left facial droop. PMH: anxiety, depression, CKD3, GERD, HTN, sclerosing adenosis of breast s/p bilateral mastectomy, VitB12 def, VitD def. MRI = Acute or subacute infarcts in the right external capsule and corona radiata.    Assessment / Plan / Recommendation Clinical Impression  Pt seen at bedside for cognitive linguistic evaluation. Pt's daughter (with whom pt lives) was present throughout. Pt reports a college education. She manages her finances independently. Daughter assists with medication administration by getting them out for pt. CN exam is remarkable for mild left labial weakness and mild left lingual deviation. Pt and daughter report pt's dysarthria has resolved and her speech is back to baseline. Pt's receptive and expressive language skills appear to be intact.   The [place] Mental Status (SLUMS) Examination was administered to assess cognition. Pt scored 24/30, raising concern for neuro-cognitive decline. Pt performed well with orientation, immediate recall, mental math (quick self correction noted), thought organization (animal naming), and auditory attention and recall. Pt recalled 4/5 unrelated words after a delay. Clock drawing was concerning, with poorly distributed numbers and inaccurate hand placement. This raises concern for higher level cognitive processes and executive functions. It is uncertain if these deficits are stroke related, or age related decline. Pt and daughter were encouraged to reach out to PCP if difficulties arise upon return to normal routines, as home health speech therapy may be beneficial. No further acute speech pathology needs at this time.    SLP Assessment  SLP Recommendation/Assessment: All further Speech Language Pathology needs can be addressed in the next venue of care if needs arise.  SLP Visit Diagnosis: Cognitive communication deficit (R41.841)    Recommendations for follow up therapy are one component of a multi-disciplinary discharge planning process, led by the attending physician.  Recommendations may be updated  based on patient status, additional functional criteria and insurance authorization.    Follow Up Recommendations  Home health SLP (if needs  arise)    Assistance Recommended at Discharge  Intermittent Supervision/Assistance  Functional Status Assessment Patient has had a recent decline in their functional status and demonstrates the ability to make significant improvements in function in a reasonable and predictable amount of time.     SLP Evaluation Cognition  Overall Cognitive Status: Within Functional Limits for tasks assessed Arousal/Alertness: Awake/alert Orientation Level: Oriented X4       Comprehension  Auditory Comprehension Overall Auditory Comprehension: Appears within functional limits for tasks assessed    Expression Expression Primary Mode of Expression: Verbal Verbal Expression Overall Verbal Expression: Appears within functional limits for tasks assessed Written Expression Dominant Hand: Right   Oral / Motor  Oral Motor/Sensory Function Overall Oral Motor/Sensory Function: Mild impairment Facial ROM: Reduced left Facial Symmetry: Abnormal symmetry left Facial Strength: Reduced left Facial Sensation: Within Functional Limits Lingual ROM: Within Functional Limits Lingual Symmetry: Abnormal symmetry left Lingual Strength: Reduced Lingual Sensation: Within Functional Limits Velum: Within Functional Limits Mandible: Within Functional Limits Motor Speech Overall Motor Speech: Appears within functional limits for tasks assessed           Ishaan Villamar B. Quentin Ore, [facility], CCC-SLP Speech Language Pathologist  Shonna Chock 05/16/2022, 12:36 PM

## 2022-05-16 NOTE — Progress Notes (Signed)
Admission profile updated. ?

## 2022-05-16 NOTE — Plan of Care (Addendum)
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   Other stroke workup this admission:  CTA H&N  1. Severe stenosis or short segment occlusion of the proximal left PCA. 2. Severe proximal right PCA stenosis. 3. Moderate to severe left vertebral artery origin stenosis. 4. Mild carotid atherosclerosis without significant stenosis. 5. 4.9 cm ascending aortic aneurysm, enlarged from 2011. Recommend semi-annual imaging followup by CTA or MRA and referral to cardiothoracic surgery if not already obtained. This recommendation follows 2010 ACCF/AHA/AATS/ACR/ASA/SCA/SCAI/SIR/STS/SVM Guidelines for the Diagnosis and Management of Patients With Thoracic Aortic Disease. Circulation. 2010; 121: G182-X937. Aortic aneurysm NOS (ICD10-I71.9) 6. Aortic Atherosclerosis (ICD10-I70.0).  TTE - pending  Stroke Labs     Component Value Date/Time   CHOL 209 (H) 05/16/2022 0548   TRIG 73 05/16/2022 0548   HDL 54 05/16/2022 0548   CHOLHDL 3.9 05/16/2022 0548   VLDL 15 05/16/2022 0548   LDLCALC 140 (H) 05/16/2022 0548    Lab Results  Component Value Date/Time   HGBA1C 5.5 05/15/2022 07:32 PM   Recommendations: - Stroke workup is complete, just waiting on TTE results. She does not need formal neurology consult as stroke workup is already done. D/w hospitalist. If TTE shows no intracardiac clot or other sig abnl, OK to discharge on the following regimen: - ASA 81mg  daily + plavix 75mg  daily x90 days f/b plavix 75mg  daily monotherapy after that (patient was on ASA PTA; will do DAPT for 90 days instead of 21 bc patient has severe intracranial stenosis). - Atorvastatin 80mg  daily - I will arrange outpatient neuro f/u - Please contact neurology for further guidance if TTE  shows sig abnl or intracardiac clot  She will need referral to cardiothoracic surgery and semiannual surveillance of her 4.9cm ascending aortic aneurysm if she does not already follow with a provider for that.  , MD [facility] [phone]  If 7pm- 7am, please page neurology on call as listed in [facility]ION.

## 2022-05-16 NOTE — Progress Notes (Signed)
*  PRELIMINARY RESULTS* Echocardiogram 2D Echocardiogram has been performed.  Joanette Gula Aneshia Jacquet 05/16/2022, 10:44 AM
